# Patient Record
Sex: Female | Born: 1945 | Race: White | Hispanic: No | Smoking: Never smoker
Health system: Southern US, Community
[De-identification: ages and names within clinical notes are randomized; demographics above are authoritative.]

## PROBLEM LIST (undated history)

## (undated) DIAGNOSIS — M8580 Other specified disorders of bone density and structure, unspecified site: Secondary | ICD-10-CM

## (undated) DIAGNOSIS — IMO0001 Reserved for inherently not codable concepts without codable children: Secondary | ICD-10-CM

## (undated) DIAGNOSIS — I10 Essential (primary) hypertension: Secondary | ICD-10-CM

## (undated) DIAGNOSIS — E871 Hypo-osmolality and hyponatremia: Secondary | ICD-10-CM

## (undated) DIAGNOSIS — M7750 Other enthesopathy of unspecified foot: Secondary | ICD-10-CM

## (undated) HISTORY — DX: Other specified disorders of bone density and structure, unspecified site: M85.80

## (undated) HISTORY — DX: Reserved for inherently not codable concepts without codable children: IMO0001

## (undated) HISTORY — DX: Essential (primary) hypertension: I10

## (undated) HISTORY — DX: Hypo-osmolality and hyponatremia: E87.1

## (undated) HISTORY — DX: Other enthesopathy of unspecified foot and ankle: M77.50

---

## 2007-02-20 ENCOUNTER — Encounter: Admission: RE | Admit: 2007-02-20 | Discharge: 2007-02-20 | Payer: Self-pay | Admitting: Internal Medicine

## 2007-03-28 ENCOUNTER — Other Ambulatory Visit: Admission: RE | Admit: 2007-03-28 | Discharge: 2007-03-28 | Payer: Self-pay | Admitting: Internal Medicine

## 2008-03-20 ENCOUNTER — Encounter: Admission: RE | Admit: 2008-03-20 | Discharge: 2008-03-20 | Payer: Self-pay | Admitting: Internal Medicine

## 2009-01-22 ENCOUNTER — Encounter: Admission: RE | Admit: 2009-01-22 | Discharge: 2009-01-22 | Payer: Self-pay | Admitting: Internal Medicine

## 2009-06-07 ENCOUNTER — Other Ambulatory Visit: Admission: RE | Admit: 2009-06-07 | Discharge: 2009-06-07 | Payer: Self-pay | Admitting: Internal Medicine

## 2009-06-07 ENCOUNTER — Ambulatory Visit: Payer: Self-pay | Admitting: Internal Medicine

## 2009-06-15 ENCOUNTER — Ambulatory Visit: Payer: Self-pay

## 2009-06-15 ENCOUNTER — Encounter: Payer: Self-pay | Admitting: Internal Medicine

## 2009-07-05 ENCOUNTER — Ambulatory Visit: Payer: Self-pay | Admitting: Internal Medicine

## 2009-07-09 ENCOUNTER — Encounter: Admission: RE | Admit: 2009-07-09 | Discharge: 2009-07-09 | Payer: Self-pay | Admitting: Internal Medicine

## 2009-07-15 ENCOUNTER — Encounter: Admission: RE | Admit: 2009-07-15 | Discharge: 2009-07-15 | Payer: Self-pay | Admitting: Internal Medicine

## 2010-01-07 ENCOUNTER — Ambulatory Visit: Payer: Self-pay | Admitting: Internal Medicine

## 2010-07-12 ENCOUNTER — Ambulatory Visit: Payer: Self-pay | Admitting: Internal Medicine

## 2010-07-25 ENCOUNTER — Encounter: Admission: RE | Admit: 2010-07-25 | Discharge: 2010-07-25 | Payer: Self-pay | Admitting: Internal Medicine

## 2010-08-02 ENCOUNTER — Ambulatory Visit: Payer: Self-pay | Admitting: Internal Medicine

## 2011-02-02 ENCOUNTER — Ambulatory Visit (INDEPENDENT_AMBULATORY_CARE_PROVIDER_SITE_OTHER): Payer: 59 | Admitting: Internal Medicine

## 2011-02-02 DIAGNOSIS — I10 Essential (primary) hypertension: Secondary | ICD-10-CM

## 2011-07-12 ENCOUNTER — Other Ambulatory Visit: Payer: Self-pay | Admitting: Internal Medicine

## 2011-07-12 DIAGNOSIS — Z1231 Encounter for screening mammogram for malignant neoplasm of breast: Secondary | ICD-10-CM

## 2011-07-14 ENCOUNTER — Other Ambulatory Visit: Payer: 59 | Admitting: Internal Medicine

## 2011-07-14 DIAGNOSIS — Z0000 Encounter for general adult medical examination without abnormal findings: Secondary | ICD-10-CM

## 2011-07-14 DIAGNOSIS — I10 Essential (primary) hypertension: Secondary | ICD-10-CM

## 2011-07-14 LAB — CBC WITH DIFFERENTIAL/PLATELET
Eosinophils Absolute: 0.9 10*3/uL — ABNORMAL HIGH (ref 0.0–0.7)
Eosinophils Relative: 16 % — ABNORMAL HIGH (ref 0–5)
HCT: 41.9 % (ref 36.0–46.0)
Hemoglobin: 13.6 g/dL (ref 12.0–15.0)
Lymphocytes Relative: 30 % (ref 12–46)
Lymphs Abs: 1.8 10*3/uL (ref 0.7–4.0)
MCH: 30.8 pg (ref 26.0–34.0)
MCV: 94.8 fL (ref 78.0–100.0)
Monocytes Absolute: 0.3 10*3/uL (ref 0.1–1.0)
Monocytes Relative: 5 % (ref 3–12)
Neutrophils Relative %: 48 % (ref 43–77)
Platelets: 223 10*3/uL (ref 150–400)
RBC: 4.42 MIL/uL (ref 3.87–5.11)
RDW: 13.5 % (ref 11.5–15.5)
WBC: 5.8 10*3/uL (ref 4.0–10.5)

## 2011-07-15 LAB — LIPID PANEL
Cholesterol: 186 mg/dL (ref 0–200)
HDL: 75 mg/dL (ref >39)
LDL Cholesterol: 98 mg/dL (ref 0–99)
Total CHOL/HDL Ratio: 2.5 Ratio
Triglycerides: 64 mg/dL (ref <150)
VLDL: 13 mg/dL (ref 0–40)

## 2011-07-15 LAB — COMPREHENSIVE METABOLIC PANEL
ALT: 11 U/L (ref 0–35)
AST: 15 U/L (ref 0–37)
Alkaline Phosphatase: 30 U/L — ABNORMAL LOW (ref 39–117)
BUN: 17 mg/dL (ref 6–23)
CO2: 25 mEq/L (ref 19–32)
Creat: 0.86 mg/dL (ref 0.50–1.10)
Glucose, Bld: 85 mg/dL (ref 70–99)
Potassium: 4.7 mEq/L (ref 3.5–5.3)
Sodium: 135 mEq/L (ref 135–145)
Total Bilirubin: 0.5 mg/dL (ref 0.3–1.2)
Total Protein: 7.1 g/dL (ref 6.0–8.3)

## 2011-07-17 ENCOUNTER — Other Ambulatory Visit: Payer: Self-pay | Admitting: Internal Medicine

## 2011-07-17 ENCOUNTER — Encounter: Payer: Self-pay | Admitting: Internal Medicine

## 2011-07-17 ENCOUNTER — Ambulatory Visit (INDEPENDENT_AMBULATORY_CARE_PROVIDER_SITE_OTHER): Payer: 59 | Admitting: Internal Medicine

## 2011-07-17 VITALS — BP 138/72 | HR 64 | Temp 97.4°F | Ht 64.0 in | Wt 128.0 lb

## 2011-07-17 DIAGNOSIS — I700 Atherosclerosis of aorta: Secondary | ICD-10-CM

## 2011-07-17 DIAGNOSIS — M8580 Other specified disorders of bone density and structure, unspecified site: Secondary | ICD-10-CM | POA: Insufficient documentation

## 2011-07-17 DIAGNOSIS — IMO0001 Reserved for inherently not codable concepts without codable children: Secondary | ICD-10-CM | POA: Insufficient documentation

## 2011-07-17 DIAGNOSIS — Z0000 Encounter for general adult medical examination without abnormal findings: Secondary | ICD-10-CM

## 2011-07-17 DIAGNOSIS — I10 Essential (primary) hypertension: Secondary | ICD-10-CM

## 2011-07-17 DIAGNOSIS — M810 Age-related osteoporosis without current pathological fracture: Secondary | ICD-10-CM

## 2011-07-17 DIAGNOSIS — D721 Eosinophilia, unspecified: Secondary | ICD-10-CM | POA: Insufficient documentation

## 2011-07-17 HISTORY — DX: Other specified disorders of bone density and structure, unspecified site: M85.80

## 2011-07-17 LAB — POCT URINALYSIS DIPSTICK
Bilirubin, UA: NEGATIVE
Blood, UA: NEGATIVE
Glucose, UA: NEGATIVE
Ketones, UA: NEGATIVE
Leukocytes, UA: NEGATIVE
Nitrite, UA: NEGATIVE
Spec Grav, UA: 1
Urobilinogen, UA: NEGATIVE
pH, UA: 6

## 2011-07-17 NOTE — Progress Notes (Signed)
Subjective:    Patient ID: Peggy Nelson, female    DOB: 1945/10/19, 65 y.o.   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 History of hypertension, osteoporosis, aortic sclerosis, eosinophilia. Patient intolerant of Benadryl it causes her to "hypertension" had tonsillectomy 1954. Compound fracture of toes in 1960. She has lived in [place] for 29 years and is a native of [place]. History of tendinitis right foot for which she is taken naproxen in the past last tetanus immunization 2008. Recently had influenza immunization at work on October 4. Had Zostavax vaccine August 2010. Had colonoscopy done 2008 by Dr. Danise Edge and repeat study recommended in 10 years from that time.  One brother and 2 sisters in good health. Father died at age 47 of cancer. Mother age 26 with hypertension. Father also had hypertension. One sister and one brother with hypertension. Patient does not smoke. Social alcohol consumption. She is a widow. Spouse died at age 66 of septic shock. Is due for mammogram soon. Will get bone density study at the same time. Last bone density study was in 2010. She started on Fosamax 70 mg weekly October 2010. Takes lisinopril 20 mg daily for hypertension. Used to take lisinopril/HCTZ but was found to be hyponatremic on HCTZ.    Review of Systems  Constitutional: Negative.   HENT: Negative.   Eyes: Negative.   Cardiovascular: Negative.   Gastrointestinal: Negative.   Genitourinary: Negative.   Musculoskeletal: Negative.   Neurological: Negative.   Hematological: Negative.   Psychiatric/Behavioral: Negative.        Objective:   Physical Exam  Vitals reviewed. Constitutional: She is oriented to person, place, and time. She appears well-nourished. No distress.  HENT:  Head: Normocephalic and atraumatic.  Right Ear: External ear normal.  Left Ear: External ear  normal.  Nose: Nose normal.  Mouth/Throat: Oropharynx is clear and moist. No oropharyngeal exudate.  Eyes: EOM are normal. Pupils are equal, round, and reactive to light. Right eye exhibits no discharge. Left eye exhibits no discharge. No scleral icterus.  Neck: Neck supple. No JVD present. No thyromegaly present.  Cardiovascular: Normal rate, regular rhythm, normal heart sounds and intact distal pulses.   Pulmonary/Chest: Effort normal and breath sounds normal. She has no rales.  Abdominal: Soft. Bowel sounds are normal. She exhibits no mass. There is no tenderness.  Genitourinary:       Pap deferred. Will be done 2013  Musculoskeletal: She exhibits no edema.  Lymphadenopathy:    She has no cervical adenopathy.  Neurological: She is alert and oriented to person, place, and time. She displays normal reflexes. Coordination normal.  Skin: Skin is warm and dry. No rash noted. She is not diaphoretic.  Psychiatric: She has a normal mood and affect. Her behavior is normal. Judgment and thought content normal.          Assessment & Plan:  Hypertension- does have element of white coat syndrome. Says blood pressure at home is excellent.  Aortic sclerosis-stable  Tendinitis right foot-chronic treated with when necessary Naproxen  Osteoporosis continue Fosamax for a total of 5 years. 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 We will recheck this in 6 months. No history of allergy symptoms. No history of urticaria or hives. No boggy nasal mucosa to suspect allergic rhinitis.

## 2011-07-27 ENCOUNTER — Ambulatory Visit
Admission: RE | Admit: 2011-07-27 | Discharge: 2011-07-27 | Disposition: A | Discharge status: Home / Self Care | Payer: 59 | Source: Ambulatory Visit | Attending: Internal Medicine | Admitting: Internal Medicine

## 2011-07-27 DIAGNOSIS — M810 Age-related osteoporosis without current pathological fracture: Secondary | ICD-10-CM

## 2011-07-27 DIAGNOSIS — Z1231 Encounter for screening mammogram for malignant neoplasm of breast: Secondary | ICD-10-CM

## 2011-08-04 ENCOUNTER — Encounter: Payer: Self-pay | Admitting: Internal Medicine

## 2011-08-07 ENCOUNTER — Telehealth: Payer: Self-pay | Admitting: Internal Medicine

## 2011-08-08 NOTE — Telephone Encounter (Signed)
Spoke with patient.  Darl Pikes Large(coder) reviewed records and said to code as preventative.

## 2011-09-26 ENCOUNTER — Other Ambulatory Visit: Payer: Self-pay

## 2011-09-26 MED ORDER — ALENDRONATE SODIUM 70 MG PO TABS: 70.0000 mg | ORAL_TABLET | ORAL | Status: DC

## 2012-01-15 ENCOUNTER — Other Ambulatory Visit: Payer: 59 | Admitting: Internal Medicine

## 2012-01-15 DIAGNOSIS — D649 Anemia, unspecified: Secondary | ICD-10-CM

## 2012-01-15 DIAGNOSIS — E785 Hyperlipidemia, unspecified: Secondary | ICD-10-CM

## 2012-01-15 LAB — LIPID PANEL
Cholesterol: 180 mg/dL (ref 0–200)
HDL: 74 mg/dL (ref >39)
LDL Cholesterol: 97 mg/dL (ref 0–99)
Total CHOL/HDL Ratio: 2.4 Ratio
Triglycerides: 46 mg/dL (ref <150)
VLDL: 9 mg/dL (ref 0–40)

## 2012-01-15 LAB — CBC WITH DIFFERENTIAL/PLATELET
Basophils Absolute: 0 10*3/uL (ref 0.0–0.1)
Basophils Relative: 0 % (ref 0–1)
Eosinophils Absolute: 1 10*3/uL — ABNORMAL HIGH (ref 0.0–0.7)
Eosinophils Relative: 15 % — ABNORMAL HIGH (ref 0–5)
HCT: 42.8 % (ref 36.0–46.0)
Hemoglobin: 13.8 g/dL (ref 12.0–15.0)
Lymphocytes Relative: 27 % (ref 12–46)
Lymphs Abs: 1.8 10*3/uL (ref 0.7–4.0)
MCH: 30.1 pg (ref 26.0–34.0)
MCHC: 32.2 g/dL (ref 30.0–36.0)
MCV: 93.2 fL (ref 78.0–100.0)
Monocytes Absolute: 0.4 10*3/uL (ref 0.1–1.0)
Monocytes Relative: 6 % (ref 3–12)
Neutro Abs: 3.4 10*3/uL (ref 1.7–7.7)
Neutrophils Relative %: 53 % (ref 43–77)
Platelets: 261 10*3/uL (ref 150–400)
RBC: 4.59 MIL/uL (ref 3.87–5.11)
RDW: 13.7 % (ref 11.5–15.5)
WBC: 6.6 10*3/uL (ref 4.0–10.5)

## 2012-01-16 ENCOUNTER — Ambulatory Visit (INDEPENDENT_AMBULATORY_CARE_PROVIDER_SITE_OTHER): Payer: 59 | Admitting: Internal Medicine

## 2012-01-16 ENCOUNTER — Encounter: Payer: Self-pay | Admitting: Internal Medicine

## 2012-01-16 VITALS — BP 180/78 | HR 80 | Temp 97.7°F | Wt 130.0 lb

## 2012-01-16 DIAGNOSIS — D721 Eosinophilia, unspecified: Secondary | ICD-10-CM

## 2012-01-16 DIAGNOSIS — I10 Essential (primary) hypertension: Secondary | ICD-10-CM

## 2012-01-16 NOTE — Progress Notes (Signed)
  Subjective:    Patient ID: Peggy Nelson, female    DOB: 1946/09/11, 66 y.o.   MRN: [id number]  HPI 66 year old white female with hypertension currently on lisinopril. Could not tolerate HCTZ because of low sodium. Blood pressures been elevated recently. She recently had a cataract extraction of left eye and plan is to have one in the right eye weight April. Says she could be anxious about that. Has noticed blood pressure at home is been running in the 130s. Initially today it was up to 180 but when I rechecked it it was 160 . Other than the upcoming cataract surgery she denies any significant stress in her life. We did repeat her CBC because she has a significant eosinophilia which dates back to at least 2008. She's never been allergy tested and really doesn't have a lot in the way of allergy symptoms in terms of itchy eyes or itchy palate are itchy ears. Does have some nasal congestion from time to time. Has not had any overseas travel to suggest parasite infection.    Review of Systems     Objective:   Physical Exam chest clear to auscultation; cardiac exam regular rate and rhythm normal S1 and S2; extremities without edema        Assessment & Plan:  Hypertension-systolic elevation today etiology uncertain. She is on an ACE inhibitor and may need to add Norvasc if blood pressure remains elevated. She'll watch it at home and return here in 4 weeks.  Eosinophilia-could be allergy related. She will seek allergy testing it with [facility]. Says this has been going on since 2008, I doubt it is a blood dyscrasia.  Cataract extraction right eye due to late April. Previously had left cataract extraction late March.

## 2012-01-16 NOTE — Patient Instructions (Signed)
Continue same medications for now. Keep an eye on her blood pressure at home. Followup in 4 weeks. We will consider adding Norvasc 5 mg daily if blood pressure remains elevated.

## 2012-01-18 ENCOUNTER — Telehealth: Payer: Self-pay

## 2012-01-18 NOTE — Telephone Encounter (Signed)
Notes ana labs faxed to Dr. Dickey Callas for upcoming appointment.

## 2012-02-13 ENCOUNTER — Ambulatory Visit
Admission: RE | Admit: 2012-02-13 | Discharge: 2012-02-13 | Disposition: A | Discharge status: Home / Self Care | Payer: 59 | Source: Ambulatory Visit | Attending: Allergy | Admitting: Allergy

## 2012-02-13 ENCOUNTER — Other Ambulatory Visit: Payer: Self-pay | Admitting: Allergy

## 2012-02-13 DIAGNOSIS — R05 Cough: Secondary | ICD-10-CM

## 2012-02-13 DIAGNOSIS — R059 Cough, unspecified: Secondary | ICD-10-CM

## 2012-02-15 ENCOUNTER — Ambulatory Visit (INDEPENDENT_AMBULATORY_CARE_PROVIDER_SITE_OTHER): Payer: 59 | Admitting: Internal Medicine

## 2012-02-15 ENCOUNTER — Encounter: Payer: Self-pay | Admitting: Internal Medicine

## 2012-02-15 VITALS — BP 132/68 | HR 76 | Temp 98.7°F | Wt 130.0 lb

## 2012-02-15 DIAGNOSIS — I10 Essential (primary) hypertension: Secondary | ICD-10-CM

## 2012-02-15 DIAGNOSIS — D721 Eosinophilia, unspecified: Secondary | ICD-10-CM

## 2012-02-15 NOTE — Patient Instructions (Signed)
Continue same medications. Return in fall 2013 for physical exam.

## 2012-02-15 NOTE — Progress Notes (Signed)
  Subjective:    Patient ID: Peggy Nelson, female    DOB: 08/28/1946, 66 y.o.   MRN: [id number]  HPI Had cataract surgery April 23 right eye. Had left eye cataract surgery March 19th. Admits she was anxious over the surgeries but all went well. In today for follow up BP check. Had allergy testing at [facility] with CXR. Had no significant allergies other than bee stings. Hx blister formation when handling orange peel but can eat oranges and drink orange juice. Also has eosinophilia. Allergist did not think it was significant. We will continue to monitor that on an annual basis. Patient thinks blood pressure was elevated recently because of anxiety over surgeries. Blood pressure is acceptable today.    Review of Systems     Objective:   Physical Exam chest clear to auscultation; cardiac exam regular rate and rhythm; extremities without edema        Assessment & Plan:  Hypertension  Status post bilateral cataract surgery  Eosinophilia  Plan: Continue lisinopril. Do not add any additional antihypertensive at this point in time. Return fall 2013 for physical examination.

## 2012-04-22 ENCOUNTER — Other Ambulatory Visit: Payer: Self-pay

## 2012-04-22 MED ORDER — LISINOPRIL 20 MG PO TABS: 20.0000 mg | ORAL_TABLET | Freq: Every day | ORAL | Status: DC

## 2012-07-16 ENCOUNTER — Other Ambulatory Visit: Payer: 59 | Admitting: Internal Medicine

## 2012-07-16 DIAGNOSIS — Z0000 Encounter for general adult medical examination without abnormal findings: Secondary | ICD-10-CM

## 2012-07-16 DIAGNOSIS — I10 Essential (primary) hypertension: Secondary | ICD-10-CM

## 2012-07-16 LAB — CBC WITH DIFFERENTIAL/PLATELET
Basophils Absolute: 0 10*3/uL (ref 0.0–0.1)
Basophils Relative: 1 % (ref 0–1)
Eosinophils Absolute: 0.7 10*3/uL (ref 0.0–0.7)
Eosinophils Relative: 12 % — ABNORMAL HIGH (ref 0–5)
HCT: 40.5 % (ref 36.0–46.0)
Hemoglobin: 13.6 g/dL (ref 12.0–15.0)
Lymphocytes Relative: 41 % (ref 12–46)
Lymphs Abs: 2.2 10*3/uL (ref 0.7–4.0)
MCH: 31.2 pg (ref 26.0–34.0)
MCHC: 33.6 g/dL (ref 30.0–36.0)
MCV: 92.9 fL (ref 78.0–100.0)
Monocytes Absolute: 0.3 10*3/uL (ref 0.1–1.0)
Monocytes Relative: 6 % (ref 3–12)
Neutro Abs: 2.1 10*3/uL (ref 1.7–7.7)
Neutrophils Relative %: 40 % — ABNORMAL LOW (ref 43–77)
Platelets: 197 10*3/uL (ref 150–400)
RBC: 4.36 MIL/uL (ref 3.87–5.11)
RDW: 13.1 % (ref 11.5–15.5)
WBC: 5.2 10*3/uL (ref 4.0–10.5)

## 2012-07-16 LAB — COMPREHENSIVE METABOLIC PANEL
ALT: 10 U/L (ref 0–35)
AST: 16 U/L (ref 0–37)
Albumin: 4.4 g/dL (ref 3.5–5.2)
Alkaline Phosphatase: 30 U/L — ABNORMAL LOW (ref 39–117)
BUN: 14 mg/dL (ref 6–23)
CO2: 28 mEq/L (ref 19–32)
Calcium: 9.6 mg/dL (ref 8.4–10.5)
Chloride: 100 mEq/L (ref 96–112)
Creat: 0.84 mg/dL (ref 0.50–1.10)
Glucose, Bld: 85 mg/dL (ref 70–99)
Potassium: 4.6 mEq/L (ref 3.5–5.3)
Sodium: 136 mEq/L (ref 135–145)
Total Bilirubin: 0.5 mg/dL (ref 0.3–1.2)
Total Protein: 6.8 g/dL (ref 6.0–8.3)

## 2012-07-16 LAB — LIPID PANEL
Cholesterol: 198 mg/dL (ref 0–200)
HDL: 73 mg/dL (ref >39)
LDL Cholesterol: 109 mg/dL — ABNORMAL HIGH (ref 0–99)
Total CHOL/HDL Ratio: 2.7 Ratio
Triglycerides: 78 mg/dL (ref <150)
VLDL: 16 mg/dL (ref 0–40)

## 2012-07-16 LAB — TSH: TSH: 2.426 u[IU]/mL (ref 0.350–4.500)

## 2012-07-17 LAB — VITAMIN D 25 HYDROXY (VIT D DEFICIENCY, FRACTURES): Vit D, 25-Hydroxy: 43 ng/mL (ref 30–89)

## 2012-07-18 ENCOUNTER — Other Ambulatory Visit (HOSPITAL_COMMUNITY)
Admission: RE | Admit: 2012-07-18 | Discharge: 2012-07-18 | Disposition: A | Discharge status: Home / Self Care | Payer: 59 | Source: Ambulatory Visit | Attending: Internal Medicine | Admitting: Internal Medicine

## 2012-07-18 ENCOUNTER — Encounter: Payer: Self-pay | Admitting: Internal Medicine

## 2012-07-18 ENCOUNTER — Ambulatory Visit (INDEPENDENT_AMBULATORY_CARE_PROVIDER_SITE_OTHER): Payer: 59 | Admitting: Internal Medicine

## 2012-07-18 VITALS — BP 136/68 | HR 84 | Temp 98.7°F | Ht 64.25 in | Wt 129.0 lb

## 2012-07-18 DIAGNOSIS — I10 Essential (primary) hypertension: Secondary | ICD-10-CM

## 2012-07-18 DIAGNOSIS — M810 Age-related osteoporosis without current pathological fracture: Secondary | ICD-10-CM

## 2012-07-18 DIAGNOSIS — Z124 Encounter for screening for malignant neoplasm of cervix: Secondary | ICD-10-CM

## 2012-07-18 DIAGNOSIS — Z01419 Encounter for gynecological examination (general) (routine) without abnormal findings: Secondary | ICD-10-CM | POA: Insufficient documentation

## 2012-07-18 DIAGNOSIS — Z23 Encounter for immunization: Secondary | ICD-10-CM

## 2012-07-18 DIAGNOSIS — D721 Eosinophilia, unspecified: Secondary | ICD-10-CM

## 2012-07-18 LAB — POCT URINALYSIS DIPSTICK
Bilirubin, UA: NEGATIVE
Glucose, UA: NEGATIVE
Leukocytes, UA: NEGATIVE
Nitrite, UA: NEGATIVE
Spec Grav, UA: 1.015
Urobilinogen, UA: NEGATIVE
pH, UA: 5.5

## 2012-07-19 DIAGNOSIS — Z23 Encounter for immunization: Secondary | ICD-10-CM

## 2012-08-06 ENCOUNTER — Other Ambulatory Visit: Payer: Self-pay | Admitting: Internal Medicine

## 2012-08-06 DIAGNOSIS — Z1231 Encounter for screening mammogram for malignant neoplasm of breast: Secondary | ICD-10-CM

## 2012-08-09 ENCOUNTER — Ambulatory Visit
Admission: RE | Admit: 2012-08-09 | Discharge: 2012-08-09 | Disposition: A | Discharge status: Home / Self Care | Payer: 59 | Source: Ambulatory Visit | Attending: Internal Medicine | Admitting: Internal Medicine

## 2012-08-09 DIAGNOSIS — Z1231 Encounter for screening mammogram for malignant neoplasm of breast: Secondary | ICD-10-CM

## 2012-10-14 ENCOUNTER — Other Ambulatory Visit: Payer: Self-pay

## 2012-10-14 MED ORDER — LISINOPRIL 20 MG PO TABS: 20.0000 mg | ORAL_TABLET | Freq: Every day | ORAL | Status: DC

## 2012-10-14 MED ORDER — ALENDRONATE SODIUM 70 MG PO TABS: 70.0000 mg | ORAL_TABLET | ORAL | Status: DC

## 2012-10-21 ENCOUNTER — Other Ambulatory Visit: Payer: Self-pay

## 2012-10-21 MED ORDER — LISINOPRIL 20 MG PO TABS: 20.0000 mg | ORAL_TABLET | Freq: Every day | ORAL | Status: DC

## 2012-10-21 MED ORDER — ALENDRONATE SODIUM 70 MG PO TABS: 70.0000 mg | ORAL_TABLET | ORAL | Status: DC

## 2012-10-25 ENCOUNTER — Other Ambulatory Visit: Payer: Self-pay

## 2012-11-13 ENCOUNTER — Other Ambulatory Visit: Payer: Self-pay | Admitting: Internal Medicine

## 2012-12-16 ENCOUNTER — Encounter: Payer: Self-pay | Admitting: Internal Medicine

## 2012-12-16 NOTE — Patient Instructions (Addendum)
Continue same medications and return in 6 months 

## 2012-12-16 NOTE — Progress Notes (Signed)
Subjective:    Patient ID: Peggy Nelson, female    DOB: 1946/04/05, 67 y.o.   MRN: [id number]  HPI 67 year old white female in today for health maintenance and evaluation of medical problems. History of aortic sclerosis, hypertension, eosinophilia, osteoporosis. Patient became hyponatremic on diuretic in the past. Currently just on lisinopril 20 mg daily since November 2011. Takes Fosamax 70 mg weekly.  Patient is intolerant of Benadryl it causes her to be hyper.  Bone density study in 2010. Colonoscopy by Dr. Laural Benes August 2008. Tetanus immunization 2008. Zostavax and August 2010. Seen by allergist may 2013 regarding eosinophilia. Dr. [place] Callas recommended following counts every 6-12 months. Allergy chest may 2013 were negative. All foods tested for allergies were negative. Chest x-ray was negative.  Patient had 2-D echocardiogram 2010 with cause of murmur. Had mildly calcified aortic valve leaflets consistent with aortic sclerosis. No SBE prophylaxis needed. Osteoporosis diagnosed in 2010. Needs followup bone density study.  Compound fracture of her toes in 1960. Tonsillectomy 1954. Takes calcium and vitamin D supplements. Takes Naprelan 375 mg as needed for foot pain.  Social history: Lived in [place] for over 29 years. A native of [place].  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 She is a widow. Does not smoke. Social alcohol consumption. Mother lives with her. She walks daily.  Family history: Father died at age 97 of cancer. One brother and 2 sisters in good health.    Review of Systems  Constitutional: Negative.   HENT: Negative.   Eyes: Negative.   Respiratory: Negative.   Endocrine: Negative.   Genitourinary: Negative.   Allergic/Immunologic: Negative.   Neurological: Negative.   Hematological: Negative.   Psychiatric/Behavioral: Negative.        Objective:   Physical Exam  Vitals reviewed. Constitutional: She is oriented to  person, place, and time. She appears well-nourished. No distress.  HENT:  Head: Normocephalic and atraumatic.  Right Ear: External ear normal.  Left Ear: External ear normal.  Mouth/Throat: Oropharynx is clear and moist. No oropharyngeal exudate.  Eyes: Conjunctivae are normal. Pupils are equal, round, and reactive to light. Right eye exhibits no discharge. Left eye exhibits no discharge. No scleral icterus.  Neck: No JVD present. No thyromegaly present.  Cardiovascular: Normal rate, regular rhythm and intact distal pulses.  Exam reveals no friction rub.   Murmur heard. 2/6 SEM  Abdominal: Bowel sounds are normal. She exhibits no distension and no mass. There is no tenderness. There is no rebound and no guarding.  Genitourinary:  Deferred to GYN  Musculoskeletal: Normal range of motion. She exhibits no edema.  Lymphadenopathy:    She has no cervical adenopathy.  Neurological: She is alert and oriented to person, place, and time. She has normal reflexes. She displays normal reflexes. No cranial nerve deficit.  Skin: Skin is warm and dry. No rash noted. She is not diaphoretic.  Psychiatric: She has a normal mood and affect. Her behavior is normal. Judgment and thought content normal.          Assessment & Plan:  History of eosinophilia -- continue to follow with no further workup as long as counts are stable  Hypertension-stable on fosinopril  History of aortic sclerosis-asymptomatic  Osteoporosis-treated with Fosamax. Needs bone density study.  History of hyponatremia when treated with diuretic  History of tendinitis right foot  History of compound fracture of toes  Plan: Return in 6 months for office visit, CBC with differential, blood pressure check, office visit.  Influenza immunization given today.

## 2013-07-11 ENCOUNTER — Other Ambulatory Visit: Payer: Self-pay

## 2013-07-11 DIAGNOSIS — Z1231 Encounter for screening mammogram for malignant neoplasm of breast: Secondary | ICD-10-CM

## 2013-07-21 ENCOUNTER — Other Ambulatory Visit: Payer: Medicare Other | Admitting: Internal Medicine

## 2013-07-21 DIAGNOSIS — Z130 Encounter for screening for diseases of the blood and blood-forming organs and certain disorders involving the immune mechanism: Secondary | ICD-10-CM

## 2013-07-21 DIAGNOSIS — Z13220 Encounter for screening for lipoid disorders: Secondary | ICD-10-CM

## 2013-07-21 DIAGNOSIS — I10 Essential (primary) hypertension: Secondary | ICD-10-CM

## 2013-07-21 DIAGNOSIS — Z1329 Encounter for screening for other suspected endocrine disorder: Secondary | ICD-10-CM

## 2013-07-21 DIAGNOSIS — Z13228 Encounter for screening for other metabolic disorders: Secondary | ICD-10-CM

## 2013-07-21 DIAGNOSIS — Z131 Encounter for screening for diabetes mellitus: Secondary | ICD-10-CM

## 2013-07-21 LAB — CBC WITH DIFFERENTIAL/PLATELET
Basophils Absolute: 0 10*3/uL (ref 0.0–0.1)
Basophils Relative: 1 % (ref 0–1)
Eosinophils Absolute: 0.6 10*3/uL (ref 0.0–0.7)
Eosinophils Relative: 12 % — ABNORMAL HIGH (ref 0–5)
HCT: 41.6 % (ref 36.0–46.0)
Hemoglobin: 14.3 g/dL (ref 12.0–15.0)
Lymphs Abs: 2 10*3/uL (ref 0.7–4.0)
MCHC: 34.4 g/dL (ref 30.0–36.0)
Monocytes Absolute: 0.3 10*3/uL (ref 0.1–1.0)
Monocytes Relative: 5 % (ref 3–12)
Neutro Abs: 2.3 10*3/uL (ref 1.7–7.7)
RDW: 14 % (ref 11.5–15.5)

## 2013-07-21 LAB — COMPREHENSIVE METABOLIC PANEL
Albumin: 4.2 g/dL (ref 3.5–5.2)
Alkaline Phosphatase: 36 U/L — ABNORMAL LOW (ref 39–117)
BUN: 18 mg/dL (ref 6–23)
Creat: 0.94 mg/dL (ref 0.50–1.10)
Glucose, Bld: 100 mg/dL — ABNORMAL HIGH (ref 70–99)
Potassium: 4.5 mEq/L (ref 3.5–5.3)

## 2013-07-21 LAB — LIPID PANEL
HDL: 75 mg/dL (ref >39)
LDL Cholesterol: 109 mg/dL — ABNORMAL HIGH (ref 0–99)
Triglycerides: 91 mg/dL (ref <150)

## 2013-07-22 ENCOUNTER — Ambulatory Visit (INDEPENDENT_AMBULATORY_CARE_PROVIDER_SITE_OTHER): Payer: Medicare Other | Admitting: Internal Medicine

## 2013-07-22 ENCOUNTER — Encounter: Payer: Self-pay | Admitting: Internal Medicine

## 2013-07-22 VITALS — BP 140/80 | HR 100 | Temp 98.9°F | Ht 64.0 in | Wt 130.0 lb

## 2013-07-22 DIAGNOSIS — Z0000 Encounter for general adult medical examination without abnormal findings: Secondary | ICD-10-CM

## 2013-07-22 DIAGNOSIS — Z23 Encounter for immunization: Secondary | ICD-10-CM

## 2013-07-22 LAB — POCT URINALYSIS DIPSTICK
Bilirubin, UA: NEGATIVE
Blood, UA: NEGATIVE
Ketones, UA: NEGATIVE
Spec Grav, UA: 1.01
Urobilinogen, UA: NEGATIVE
pH, UA: 6.5

## 2013-07-22 LAB — VITAMIN D 25 HYDROXY (VIT D DEFICIENCY, FRACTURES): Vit D, 25-Hydroxy: 55 ng/mL (ref 30–89)

## 2013-07-22 NOTE — Progress Notes (Signed)
Subjective:    Patient ID: Peggy Nelson, female    DOB: 06-Sep-1946, 67 y.o.   MRN: [id number]  HPI  67 year old White Female for Welcome to Medicare Exam. History of aortic sclerosis, hypertension, eosinophilia, osteoporosis. Patient has history of being hyponatremic on diuretic in the past. Currently just on lisinopril 20 mg daily for hypertension since November 2011. Takes Fosamax 70 mg weekly for osteoporosis.  Patient is intolerant of Benadryl-it causes her to be hyperactive.  Patient had 2-D echocardiogram 2010 do to murmur. She had mildly calcified aortic valve leaflets consistent with aortic sclerosis. No SBE prophylaxis needed.  Osteoporosis diagnosed in 2010 be a bone density study. Last bone density study done in 2012 showing significant osteoporosis. Colonoscopy by Dr. Laural Benes in August 2008. Tetanus immunization in 2008. Zostavax vaccine given August 2010.  Seen by allergist in May 2013 regarding eosinophilia. Dr. [place] Callas recommended following counts every 6-12 months. Allergy test done in May 2013 were negative. All foods tested for allergies were negative. Chest x-ray was negative.  Additional past medical history: Compound fracture of her toes in 1960. Tonsillectomy 1954.  She takes as needed for foot pain. She takes calcium and vitamin D supplements.  Social history: She is a native of [place] but has lived in [place] for over 29 years. 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 She is a widow. Does not smoke. Social alcohol consumption. Mother lives with her. She walks daily.  Family history: Father died at age 44 of cancer. One brother and 2 sisters in good health.      Review of Systems  Constitutional: Negative.   All other systems reviewed and are negative.       Objective:   Physical Exam  Vitals reviewed. Constitutional: She appears well-developed and well-nourished. No distress.  Eyes: Conjunctivae and EOM are normal.   Neck: Neck supple.  Pulmonary/Chest:  Breasts normal female  Genitourinary:  Pap done 2013 deferred. Bimanual exam normal  Skin: Skin is warm and dry. No rash noted. She is not diaphoretic.  Psychiatric: She has a normal mood and affect. Her behavior is normal. Judgment and thought content normal.          Assessment & Plan:   Essential hypertension-stable current regimen  Osteoporosis-treated with vitamin D, calcium supplement and Fosamax. Last bone density study 2012. Repeat bone density study in 2015.  History of eosinophilia-followed with CBCs. Previously seen by allergist.  History of aortic sclerosis-asymptomatic  Plan: Return in one year or as needed. Continue same medications. Recommend annual mammogram.     Subjective:   Patient presents for Medicare Annual/Subsequent preventive examination.   Review Past Medical/Family/Social: no change   Risk Factors  Current exercise habits: walk- 5 miles a week Dietary issues discussed: low fat low carb diet  Cardiac risk factors: see above for HPI  Depression Screen  (Note: if answer to either of the following is "Yes", a more complete depression screening is indicated)   Over the past two weeks, have you felt down, depressed or hopeless? No  Over the past two weeks, have you felt little interest or pleasure in doing things? No Have you lost interest or pleasure in daily life? No Do you often feel hopeless? No Do you cry easily over simple problems? No   Activities of Daily Living  In your present state of health, do you have any difficulty performing the following activities?:   Driving? No  Managing money? No  Feeding yourself? No  Getting from bed to chair? No  Climbing a flight of stairs? No  Preparing food and eating?: No  Bathing or showering? No  Getting dressed: No  Getting to the toilet? No  Using the toilet:No  Moving around from place to place: No  In the past year have you fallen or had a near  fall?:No  Are you sexually active? No  Do you have more than one partner? No   Hearing Difficulties: No  Do you often ask people to speak up or repeat themselves? No  Do you experience ringing or noises in your ears? No  Do you have difficulty understanding soft or whispered voices? No  Do you feel that you have a problem with memory? No Do you often misplace items? No    Home Safety:  Do you have a smoke alarm at your residence? Yes Do you have grab bars in the bathroom? yes Do you have throw rugs in your house? yes   Cognitive Testing  Alert? Yes Normal Appearance?Yes  Oriented to person? Yes Place? Yes  Time? Yes  Recall of three objects? Yes  Can perform simple calculations? Yes  Displays appropriate judgment?Yes  Can read the correct time from a watch face?Yes   List the Names of Other Physician/Practitioners you currently use:  See referral list for the physicians patient is currently seeing. Dr. Elmer Picker, opthalmology    Review of Systems: see above   Objective:     General appearance: Appears stated age  Head: Normocephalic, without obvious abnormality, atraumatic  Eyes: conj clear, EOMi PEERLA  Ears: normal TM's and external ear canals both ears  Nose: Nares normal. Septum midline. Mucosa normal. No drainage or sinus tenderness.  Throat: lips, mucosa, and tongue normal; teeth and gums normal  Neck: no adenopathy, no carotid bruit, no JVD, supple, symmetrical, trachea midline and thyroid not enlarged, symmetric, no tenderness/mass/nodules  No CVA tenderness.  Lungs: clear to auscultation bilaterally  Breasts: normal appearance, no masses or tendernessHeart: regular rate and rhythm, S1, S2 normal, no murmur, click, rub or gallop  Abdomen: soft, non-tender; bowel sounds normal; no masses, no organomegaly  Musculoskeletal: ROM normal in all joints, no crepitus, no deformity, Normal muscle strengthen. Back  is symmetric, no curvature. Skin: Skin color, texture,  turgor normal. No rashes or lesions  Lymph nodes: Cervical, supraclavicular, and axillary nodes normal.  Neurologic: CN 2 -12 Normal, Normal symmetric reflexes. Normal coordination and gait  Psych: Alert & Oriented x 3, Mood appear stable.    Assessment:    Annual wellness medicare exam   Plan:    During the course of the visit the patient was educated and counseled about appropriate screening and preventive services including:   Annual mammogram Flu vaccine given Colonoscopy up to date Wears seatbelts Bone density study last year     Patient Instructions (the written plan) was given to the patient.  Medicare Attestation  I have personally reviewed:  The patient's medical and social history  Their use of alcohol, tobacco or illicit drugs  Their current medications and supplements  The patient's functional ability including ADLs,fall risks, home safety risks, cognitive, and hearing and visual impairment  Diet and physical activities  Evidence for depression or mood disorders  The patient's weight, height, BMI, and visual acuity have been recorded in the chart. I have made referrals, counseling, and provided education to the patient based on review of the above and I have provided the patient with a  written personalized care plan for preventive services.

## 2013-07-22 NOTE — Patient Instructions (Addendum)
Continue same medications and return in one year. Please monitor blood pressure with home blood pressure cuff. Call if persistently elevated.

## 2013-08-06 ENCOUNTER — Ambulatory Visit (INDEPENDENT_AMBULATORY_CARE_PROVIDER_SITE_OTHER): Payer: Medicare Other | Admitting: Internal Medicine

## 2013-08-06 DIAGNOSIS — Z23 Encounter for immunization: Secondary | ICD-10-CM

## 2013-08-12 ENCOUNTER — Ambulatory Visit: Payer: 59

## 2013-08-12 ENCOUNTER — Ambulatory Visit
Admission: RE | Admit: 2013-08-12 | Discharge: 2013-08-12 | Disposition: A | Discharge status: Home / Self Care | Payer: Medicare Other | Source: Ambulatory Visit

## 2013-08-12 DIAGNOSIS — Z1231 Encounter for screening mammogram for malignant neoplasm of breast: Secondary | ICD-10-CM

## 2013-08-27 ENCOUNTER — Other Ambulatory Visit: Payer: Self-pay | Admitting: *Deleted

## 2013-08-27 MED ORDER — LISINOPRIL 20 MG PO TABS: 20.0000 mg | ORAL_TABLET | Freq: Every day | ORAL | Status: DC

## 2013-08-27 MED ORDER — ALENDRONATE SODIUM 70 MG PO TABS: ORAL_TABLET | ORAL | Status: DC

## 2013-12-04 ENCOUNTER — Encounter: Payer: Self-pay | Admitting: Internal Medicine

## 2014-01-29 ENCOUNTER — Other Ambulatory Visit: Payer: Medicare Other | Admitting: Internal Medicine

## 2014-01-30 ENCOUNTER — Ambulatory Visit (INDEPENDENT_AMBULATORY_CARE_PROVIDER_SITE_OTHER): Payer: Medicare Other | Admitting: Internal Medicine

## 2014-01-30 ENCOUNTER — Encounter: Payer: Self-pay | Admitting: Internal Medicine

## 2014-01-30 VITALS — BP 130/70 | HR 84 | Temp 98.1°F | Wt 131.0 lb

## 2014-01-30 DIAGNOSIS — I10 Essential (primary) hypertension: Secondary | ICD-10-CM

## 2014-01-30 NOTE — Patient Instructions (Signed)
Continue same medication return in 6 months for physical exam

## 2014-01-30 NOTE — Progress Notes (Signed)
   Subjective:    Patient ID: Peggy Nelson, female    DOB: 02/28/1946, 68 y.o.   MRN: [id number]  HPI  68 year old white female in today for six-month recheck on hypertension. Has been completely retired now for almost a year. Is enjoying herself and finding things to do. Brings in multiple blood pressure readings over the last couple of months. Systolic blood pressure has ranged from 112-135. Diastolics have ranged from 56-70. Feels well on blood pressure medication at this point in time. Says sometimes it runs a bit high early morning but I feel these readings are acceptable.    Review of Systems     Objective:   Physical Exam  Neck is supple without JVD thyromegaly or carotid bruits. Chest clear to auscultation. Cardiac exam regular rate and rhythm normal S1 and S2. Extremities without edema.     Assessment & Plan:  Plan: Patient wl return  else in for physical examination mid-October 2015      Hypertension well controlled  Plan: Return in 6 months for physical exam.

## 2014-07-27 ENCOUNTER — Other Ambulatory Visit: Payer: Medicare Other | Admitting: Internal Medicine

## 2014-07-27 DIAGNOSIS — Z130 Encounter for screening for diseases of the blood and blood-forming organs and certain disorders involving the immune mechanism: Secondary | ICD-10-CM

## 2014-07-27 DIAGNOSIS — Z0000 Encounter for general adult medical examination without abnormal findings: Secondary | ICD-10-CM

## 2014-07-27 DIAGNOSIS — Z13220 Encounter for screening for lipoid disorders: Secondary | ICD-10-CM

## 2014-07-27 DIAGNOSIS — Z1329 Encounter for screening for other suspected endocrine disorder: Secondary | ICD-10-CM

## 2014-07-27 DIAGNOSIS — I10 Essential (primary) hypertension: Secondary | ICD-10-CM

## 2014-07-27 LAB — LIPID PANEL
CHOLESTEROL: 201 mg/dL — AB (ref 0–200)
HDL: 80 mg/dL (ref >39)
LDL CALC: 106 mg/dL — AB (ref 0–99)
TRIGLYCERIDES: 77 mg/dL (ref <150)
Total CHOL/HDL Ratio: 2.5 Ratio
VLDL: 15 mg/dL (ref 0–40)

## 2014-07-27 LAB — CBC WITH DIFFERENTIAL/PLATELET
BASOS PCT: 0 % (ref 0–1)
Basophils Absolute: 0 10*3/uL (ref 0.0–0.1)
EOS PCT: 21 % — AB (ref 0–5)
Eosinophils Absolute: 1.1 10*3/uL — ABNORMAL HIGH (ref 0.0–0.7)
HEMATOCRIT: 40.8 % (ref 36.0–46.0)
HEMOGLOBIN: 14.2 g/dL (ref 12.0–15.0)
LYMPHS ABS: 1.8 10*3/uL (ref 0.7–4.0)
Lymphocytes Relative: 36 % (ref 12–46)
MCH: 31.7 pg (ref 26.0–34.0)
MCHC: 34.8 g/dL (ref 30.0–36.0)
MCV: 91.1 fL (ref 78.0–100.0)
MONO ABS: 0.3 10*3/uL (ref 0.1–1.0)
Monocytes Relative: 6 % (ref 3–12)
NEUTROS PCT: 37 % — AB (ref 43–77)
Neutro Abs: 1.9 10*3/uL (ref 1.7–7.7)
Platelets: 259 10*3/uL (ref 150–400)
RBC: 4.48 MIL/uL (ref 3.87–5.11)
RDW: 13.6 % (ref 11.5–15.5)
WBC: 5.1 10*3/uL (ref 4.0–10.5)

## 2014-07-27 LAB — COMPREHENSIVE METABOLIC PANEL
ALK PHOS: 37 U/L — AB (ref 39–117)
ALT: 11 U/L (ref 0–35)
AST: 17 U/L (ref 0–37)
Albumin: 4.3 g/dL (ref 3.5–5.2)
BILIRUBIN TOTAL: 0.6 mg/dL (ref 0.2–1.2)
BUN: 16 mg/dL (ref 6–23)
CO2: 27 mEq/L (ref 19–32)
CREATININE: 0.9 mg/dL (ref 0.50–1.10)
Calcium: 9.6 mg/dL (ref 8.4–10.5)
Chloride: 97 mEq/L (ref 96–112)
Glucose, Bld: 92 mg/dL (ref 70–99)
Potassium: 4.6 mEq/L (ref 3.5–5.3)
SODIUM: 135 meq/L (ref 135–145)
TOTAL PROTEIN: 7 g/dL (ref 6.0–8.3)

## 2014-07-27 LAB — TSH: TSH: 3.715 u[IU]/mL (ref 0.350–4.500)

## 2014-07-28 ENCOUNTER — Ambulatory Visit (INDEPENDENT_AMBULATORY_CARE_PROVIDER_SITE_OTHER): Payer: Medicare Other | Admitting: Internal Medicine

## 2014-07-28 VITALS — BP 148/72 | HR 85 | Temp 98.3°F | Ht 63.5 in | Wt 133.0 lb

## 2014-07-28 DIAGNOSIS — I10 Essential (primary) hypertension: Secondary | ICD-10-CM

## 2014-07-28 DIAGNOSIS — IMO0001 Reserved for inherently not codable concepts without codable children: Secondary | ICD-10-CM

## 2014-07-28 DIAGNOSIS — Z0000 Encounter for general adult medical examination without abnormal findings: Secondary | ICD-10-CM

## 2014-07-28 DIAGNOSIS — M810 Age-related osteoporosis without current pathological fracture: Secondary | ICD-10-CM

## 2014-07-28 DIAGNOSIS — I700 Atherosclerosis of aorta: Secondary | ICD-10-CM

## 2014-07-28 DIAGNOSIS — Z23 Encounter for immunization: Secondary | ICD-10-CM

## 2014-07-28 DIAGNOSIS — Z862 Personal history of diseases of the blood and blood-forming organs and certain disorders involving the immune mechanism: Secondary | ICD-10-CM

## 2014-07-28 DIAGNOSIS — Z1239 Encounter for other screening for malignant neoplasm of breast: Secondary | ICD-10-CM

## 2014-07-28 LAB — POCT URINALYSIS DIPSTICK
Bilirubin, UA: NEGATIVE
GLUCOSE UA: NEGATIVE
Ketones, UA: NEGATIVE
Nitrite, UA: NEGATIVE
PROTEIN UA: NEGATIVE
RBC UA: NEGATIVE
SPEC GRAV UA: 1.01
Urobilinogen, UA: NEGATIVE
pH, UA: 6.5

## 2014-07-30 ENCOUNTER — Other Ambulatory Visit: Payer: Self-pay | Admitting: Internal Medicine

## 2014-07-30 DIAGNOSIS — Z1231 Encounter for screening mammogram for malignant neoplasm of breast: Secondary | ICD-10-CM

## 2014-08-21 ENCOUNTER — Ambulatory Visit
Admission: RE | Admit: 2014-08-21 | Discharge: 2014-08-21 | Disposition: A | Discharge status: Home / Self Care | Payer: Medicare Other | Source: Ambulatory Visit | Attending: Internal Medicine | Admitting: Internal Medicine

## 2014-08-21 DIAGNOSIS — Z0000 Encounter for general adult medical examination without abnormal findings: Secondary | ICD-10-CM

## 2014-08-21 DIAGNOSIS — M8580 Other specified disorders of bone density and structure, unspecified site: Secondary | ICD-10-CM

## 2014-08-21 DIAGNOSIS — Z1231 Encounter for screening mammogram for malignant neoplasm of breast: Secondary | ICD-10-CM

## 2014-08-21 DIAGNOSIS — Z1239 Encounter for other screening for malignant neoplasm of breast: Secondary | ICD-10-CM

## 2014-08-24 ENCOUNTER — Telehealth: Payer: Self-pay

## 2014-08-24 MED ORDER — LISINOPRIL 20 MG PO TABS: 20.0000 mg | ORAL_TABLET | Freq: Every day | ORAL | Status: DC

## 2014-08-24 MED ORDER — ALENDRONATE SODIUM 70 MG PO TABS: ORAL_TABLET | ORAL | Status: DC

## 2014-08-24 NOTE — Telephone Encounter (Signed)
-----   Message from Margaree MackintoshMary J Baxley, MD sent at 08/21/2014  3:38 PM EST ----- Fosamax is working. Please continue.

## 2014-08-24 NOTE — Telephone Encounter (Signed)
Patient aware of bone density results.  Order for fosamax and lisinopril printed per patient for refill.

## 2014-09-06 ENCOUNTER — Encounter: Payer: Self-pay | Admitting: Internal Medicine

## 2014-09-06 NOTE — Progress Notes (Signed)
Subjective:    Patient ID: Peggy Nelson, female    DOB: 02/13/1946, 68 y.o.   MRN: [id number]  HPI  68 year old white female in today for health maintenance exam and evaluation of medical issues. History of aortic sclerosis, hypertension, eosinophilia, osteoporosis. 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 Currently just on the sun up real 20 mg daily for hypertension since November 2011. Takes Fosamax 70 mg weekly for osteoporosis.  Patient had a 2-D echocardiogram in 2010 because of murmur. She had mildly calcified aortic valve leaflets consistent with aortic sclerosis. No SBE prophylaxis needed.  Patient had a compound fracture of her toes in 1960. Tonsillectomy 1954.  Social history: She is a native of [place] but has lived in [place] for over 29 years. 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 Has a college degree. She is a widow. Does not smoke. Social alcohol consumption. Mother lives with her. She walks daily.  Family history: Father died at age 67 of cancer. One brother and 2 sisters in good health.  Osteoporosis diagnosed in 2010 by bone density study. She is on Fosamax.  Colonoscopy done by Dr. Laural BenesJohnson in 2008.  Tetanus immunization in 2008. Zostavax vaccine given August 2010.  Patient is intolerant of Benadryl-it causes her to be hyperactive.  Patient saw Dr. Beaver CallasSharma in May 2013 Re: Eosinophilia. He recommended following counts every 6-12 months. Allergy testing done in May 2013 was negative. All foods tested for allergy were negative. Chest x-ray was negative.    Review of Systems  Constitutional: Negative.   All other systems reviewed and are negative.      Objective:   Physical Exam  Constitutional: She is oriented to person, place, and time. She appears well-developed and well-nourished. No distress.  HENT:  Head: Normocephalic and atraumatic.  Right Ear: External ear normal.  Left Ear: External ear normal.    Mouth/Throat: Oropharynx is clear and moist.  Eyes: Conjunctivae and EOM are normal. Pupils are equal, round, and reactive to light. Right eye exhibits no discharge. Left eye exhibits no discharge.  Neck: Neck supple. No JVD present. No thyromegaly present.  Cardiovascular: Normal rate and regular rhythm.   1/6 systolic ejection murmur  Pulmonary/Chest: Effort normal and breath sounds normal. No respiratory distress. She has no wheezes. She has no rales.  Breasts normal female  Abdominal: Bowel sounds are normal. She exhibits no distension. There is no tenderness. There is no rebound and no guarding.  Genitourinary:  Pap done in 2013. Bimanual exam normal.  Musculoskeletal: Normal range of motion. She exhibits no edema.  Lymphadenopathy:    She has no cervical adenopathy.  Neurological: She is alert and oriented to person, place, and time. She has normal reflexes. No cranial nerve deficit. Coordination normal.  Skin: Skin is warm and dry. No rash noted. She is not diaphoretic.  Psychiatric: She has a normal mood and affect. Her behavior is normal. Judgment and thought content normal.  Vitals reviewed.         Assessment & Plan:  History of aortic sclerosis- stable and asymptomatic  Essential hypertension stable on current regimen   Osteoporosis-stable with Fosamax. Have bone density study.  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 Previously seen by allergist.  Plan: Return in one year or as needed.  Subjective:   Patient presents for Medicare Annual/Subsequent preventive examination.  Review Past Medical/Family/Social: See above   Risk Factors  Current exercise habits: Fairly active Dietary issues  discussed: Low fat low car  Cardiac risk factors: Hypertension  Depression Screen  (Note: if answer to either of the following is "Yes", a more complete depression screening is indicated)   Over the past two weeks, have you felt down, depressed or hopeless? No   Over the past two weeks, have you felt little interest or pleasure in doing things? No Have you lost interest or pleasure in daily life? No Do you often feel hopeless? No Do you cry easily over simple problems? No   Activities of Daily Living  In your present state of health, do you have any difficulty performing the following activities?:   Driving? No  Managing money? No  Feeding yourself? No  Getting from bed to chair? No  Climbing a flight of stairs? No  Preparing food and eating?: No  Bathing or showering? No  Getting dressed: No  Getting to the toilet? No  Using the toilet:No  Moving around from place to place: No  In the past year have you fallen or had a near fall?:No  Are you sexually active? No  Do you have more than one partner? No   Hearing Difficulties: No  Do you often ask people to speak up or repeat themselves? No  Do you experience ringing or noises in your ears? No  Do you have difficulty understanding soft or whispered voices? No  Do you feel that you have a problem with memory? No Do you often misplace items? No    Home Safety:  Do you have a smoke alarm at your residence? Yes Do you have grab bars in the bathroom? Yes Do you have throw rugs in your house? Yes   Cognitive Testing  Alert? Yes Normal Appearance?Yes  Oriented to person? Yes Place? Yes  Time? Yes  Recall of three objects? Yes  Can perform simple calculations? Yes  Displays appropriate judgment?Yes  Can read the correct time from a watch face?Yes   List the Names of Other Physician/Practitioners you currently use:  See referral list for the physicians patient is currently seeing.  Eye physician   Review of Systems: See above  Objective:     General appearance: Appears stated age  Head: Normocephalic, without obvious abnormality, atraumatic  Eyes: conj clear, EOMi PEERLA  Ears: normal TM's and external ear canals both ears  Nose: Nares normal. Septum midline. Mucosa normal.  No drainage or sinus tenderness.  Throat: lips, mucosa, and tongue normal; teeth and gums normal  Neck: no adenopathy, no carotid bruit, no JVD, supple, symmetrical, trachea midline and thyroid not enlarged, symmetric, no tenderness/mass/nodules  No CVA tenderness.  Lungs: clear to auscultation bilaterally  Breasts: normal appearance, no masses or tenderness Heart: regular rate and rhythm, S1, S2 normal Abdomen: soft, non-tender; bowel sounds normal; no masses, no organomegaly  Musculoskeletal: ROM normal in all joints, no crepitus, no deformity, Normal muscle strengthen. Back  is symmetric, no curvature. Skin: Skin color, texture, turgor normal. No rashes or lesions  Lymph nodes: Cervical, supraclavicular, and axillary nodes normal.  Neurologic: CN 2 -12 Normal, Normal symmetric reflexes. Normal coordination and gait  Psych: Alert & Oriented x 3, Mood appear stable.    Assessment:    Annual wellness medicare exam   Plan:    During the course of the visit the patient was educated and counseled about appropriate screening and preventive services including:   Annual mammogram  Patient have bone density study this year     Patient Instructions (the written plan)  was given to the patient.  Medicare Attestation  I have personally reviewed:  The patient's medical and social history  Their use of alcohol, tobacco or illicit drugs  Their current medications and supplements  The patient's functional ability including ADLs,fall risks, home safety risks, cognitive, and hearing and visual impairment  Diet and physical activities  Evidence for depression or mood disorders  The patient's weight, height, BMI, and visual acuity have been recorded in the chart. I have made referrals, counseling, and provided education to the patient based on review of the above and I have provided the patient with a written personalized care plan for preventive services.

## 2014-09-06 NOTE — Patient Instructions (Signed)
Return in one year or as needed. Continue same medication. Have bone density study.

## 2014-09-23 ENCOUNTER — Encounter: Payer: Self-pay | Admitting: Internal Medicine

## 2015-02-04 ENCOUNTER — Other Ambulatory Visit: Payer: Medicare Other | Admitting: Internal Medicine

## 2015-02-04 DIAGNOSIS — R5383 Other fatigue: Secondary | ICD-10-CM

## 2015-02-04 LAB — CBC WITH DIFFERENTIAL/PLATELET
BASOS PCT: 0 % (ref 0–1)
Basophils Absolute: 0 10*3/uL (ref 0.0–0.1)
Eosinophils Absolute: 0.7 10*3/uL (ref 0.0–0.7)
Eosinophils Relative: 13 % — ABNORMAL HIGH (ref 0–5)
HCT: 40 % (ref 36.0–46.0)
Hemoglobin: 13.3 g/dL (ref 12.0–15.0)
LYMPHS PCT: 34 % (ref 12–46)
Lymphs Abs: 1.7 10*3/uL (ref 0.7–4.0)
MCH: 30.9 pg (ref 26.0–34.0)
MCHC: 33.3 g/dL (ref 30.0–36.0)
MCV: 92.8 fL (ref 78.0–100.0)
MPV: 9.2 fL (ref 8.6–12.4)
Monocytes Absolute: 0.4 10*3/uL (ref 0.1–1.0)
Monocytes Relative: 8 % (ref 3–12)
NEUTROS ABS: 2.3 10*3/uL (ref 1.7–7.7)
NEUTROS PCT: 45 % (ref 43–77)
PLATELETS: 233 10*3/uL (ref 150–400)
RBC: 4.31 MIL/uL (ref 3.87–5.11)
RDW: 13.9 % (ref 11.5–15.5)
WBC: 5.1 10*3/uL (ref 4.0–10.5)

## 2015-02-05 ENCOUNTER — Ambulatory Visit (INDEPENDENT_AMBULATORY_CARE_PROVIDER_SITE_OTHER): Payer: Medicare Other | Admitting: Internal Medicine

## 2015-02-05 ENCOUNTER — Encounter: Payer: Self-pay | Admitting: Internal Medicine

## 2015-02-05 VITALS — BP 128/76 | HR 74 | Temp 98.0°F | Wt 133.0 lb

## 2015-02-05 DIAGNOSIS — D721 Eosinophilia, unspecified: Secondary | ICD-10-CM

## 2015-02-05 DIAGNOSIS — I10 Essential (primary) hypertension: Secondary | ICD-10-CM

## 2015-02-05 NOTE — Patient Instructions (Signed)
Continue to monitor eosinophilia. Continue same antihypertensive medication. Return in 6 months for physical examination.

## 2015-02-05 NOTE — Progress Notes (Signed)
   Subjective:    Patient ID: Peggy Nelson, female    DOB: 06/25/1946, 69 y.o.   MRN: [id number]  HPI  Patient brings in multiple blood pressure readings which are all very acceptable. Here today to follow-up on hypertension at 6 months interval. She also had a CBC because at last visit she had 21% eosinophils which is high. Generally has 2012 and 13%. She is asymptomatic. She did see allergist who was not too concerned about the eosinophilia. CBC drawn recently shows 13% eosinophils which is stable and improved from last reading.  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    Review of Systems     Objective:   Physical Exam  Skin warm and dry. Nodes none. No adenopathy. No JVD or thyromegaly. No carotid bruits. Chest clear to auscultation. Cardiac exam regular rate and rhythm normal S1 and S2. Extremities without edema.      Assessment & Plan:  Essential hypertension-stable on current regimen  Eosinophilia  Plan: Return in 6 months for office visit and physical examination. Continue same medications. Continue to monitor eosinophilia every 6 months

## 2015-07-29 ENCOUNTER — Other Ambulatory Visit: Payer: Medicare Other | Admitting: Internal Medicine

## 2015-07-30 ENCOUNTER — Encounter: Payer: Medicare Other | Admitting: Internal Medicine

## 2015-08-05 ENCOUNTER — Other Ambulatory Visit: Payer: Medicare Other | Admitting: Internal Medicine

## 2015-08-05 DIAGNOSIS — Z1329 Encounter for screening for other suspected endocrine disorder: Secondary | ICD-10-CM

## 2015-08-05 DIAGNOSIS — M810 Age-related osteoporosis without current pathological fracture: Secondary | ICD-10-CM

## 2015-08-05 DIAGNOSIS — Z0000 Encounter for general adult medical examination without abnormal findings: Secondary | ICD-10-CM

## 2015-08-05 DIAGNOSIS — Z130 Encounter for screening for diseases of the blood and blood-forming organs and certain disorders involving the immune mechanism: Secondary | ICD-10-CM

## 2015-08-05 DIAGNOSIS — I10 Essential (primary) hypertension: Secondary | ICD-10-CM

## 2015-08-05 LAB — CBC WITH DIFFERENTIAL/PLATELET
BASOS ABS: 0 10*3/uL (ref 0.0–0.1)
BASOS PCT: 1 % (ref 0–1)
EOS ABS: 0.6 10*3/uL (ref 0.0–0.7)
EOS PCT: 15 % — AB (ref 0–5)
HCT: 41 % (ref 36.0–46.0)
Hemoglobin: 14.2 g/dL (ref 12.0–15.0)
Lymphocytes Relative: 38 % (ref 12–46)
Lymphs Abs: 1.6 10*3/uL (ref 0.7–4.0)
MCH: 31.8 pg (ref 26.0–34.0)
MCHC: 34.6 g/dL (ref 30.0–36.0)
MCV: 91.7 fL (ref 78.0–100.0)
MONO ABS: 0.3 10*3/uL (ref 0.1–1.0)
MPV: 9.2 fL (ref 8.6–12.4)
Monocytes Relative: 7 % (ref 3–12)
Neutro Abs: 1.7 10*3/uL (ref 1.7–7.7)
Neutrophils Relative %: 39 % — ABNORMAL LOW (ref 43–77)
PLATELETS: 220 10*3/uL (ref 150–400)
RBC: 4.47 MIL/uL (ref 3.87–5.11)
RDW: 13.6 % (ref 11.5–15.5)
WBC: 4.3 10*3/uL (ref 4.0–10.5)

## 2015-08-05 LAB — COMPLETE METABOLIC PANEL WITH GFR
ALT: 11 U/L (ref 6–29)
AST: 15 U/L (ref 10–35)
Albumin: 4.4 g/dL (ref 3.6–5.1)
Alkaline Phosphatase: 29 U/L — ABNORMAL LOW (ref 33–130)
BUN: 19 mg/dL (ref 7–25)
CHLORIDE: 96 mmol/L — AB (ref 98–110)
CO2: 28 mmol/L (ref 20–31)
Calcium: 9.7 mg/dL (ref 8.6–10.4)
Creat: 0.78 mg/dL (ref 0.50–0.99)
GFR, Est African American: >89 mL/min (ref >=60)
GFR, Est Non African American: 79 mL/min (ref >=60)
Glucose, Bld: 93 mg/dL (ref 65–99)
Potassium: 5.5 mmol/L — ABNORMAL HIGH (ref 3.5–5.3)
Sodium: 133 mmol/L — ABNORMAL LOW (ref 135–146)
Total Bilirubin: 0.5 mg/dL (ref 0.2–1.2)
Total Protein: 7.3 g/dL (ref 6.1–8.1)

## 2015-08-05 LAB — LIPID PANEL
CHOL/HDL RATIO: 2.3 ratio (ref <=5.0)
Cholesterol: 192 mg/dL (ref 125–200)
HDL: 83 mg/dL (ref >=46)
LDL Cholesterol: 96 mg/dL (ref <130)
Triglycerides: 66 mg/dL (ref <150)
VLDL: 13 mg/dL (ref <30)

## 2015-08-05 LAB — TSH: TSH: 2.098 u[IU]/mL (ref 0.350–4.500)

## 2015-08-06 LAB — VITAMIN D 25 HYDROXY (VIT D DEFICIENCY, FRACTURES): VIT D 25 HYDROXY: 46 ng/mL (ref 30–100)

## 2015-08-09 ENCOUNTER — Encounter: Payer: Self-pay | Admitting: Internal Medicine

## 2015-08-09 ENCOUNTER — Ambulatory Visit (INDEPENDENT_AMBULATORY_CARE_PROVIDER_SITE_OTHER): Payer: Medicare Other | Admitting: Internal Medicine

## 2015-08-09 VITALS — BP 134/80 | HR 78 | Temp 98.0°F | Resp 18 | Ht 64.0 in | Wt 130.0 lb

## 2015-08-09 DIAGNOSIS — I700 Atherosclerosis of aorta: Secondary | ICD-10-CM | POA: Diagnosis not present

## 2015-08-09 DIAGNOSIS — D721 Eosinophilia, unspecified: Secondary | ICD-10-CM

## 2015-08-09 DIAGNOSIS — Z23 Encounter for immunization: Secondary | ICD-10-CM | POA: Diagnosis not present

## 2015-08-09 DIAGNOSIS — IMO0001 Reserved for inherently not codable concepts without codable children: Secondary | ICD-10-CM

## 2015-08-09 DIAGNOSIS — M810 Age-related osteoporosis without current pathological fracture: Secondary | ICD-10-CM

## 2015-08-09 DIAGNOSIS — Z0000 Encounter for general adult medical examination without abnormal findings: Secondary | ICD-10-CM

## 2015-08-09 DIAGNOSIS — I10 Essential (primary) hypertension: Secondary | ICD-10-CM | POA: Diagnosis not present

## 2015-08-09 LAB — POCT URINALYSIS DIPSTICK
BILIRUBIN UA: NEGATIVE
Blood, UA: NEGATIVE
GLUCOSE UA: NEGATIVE
Ketones, UA: NEGATIVE
LEUKOCYTES UA: NEGATIVE
NITRITE UA: NEGATIVE
Protein, UA: NEGATIVE
Spec Grav, UA: 1.02
UROBILINOGEN UA: 0.2
pH, UA: 6.5

## 2015-08-09 LAB — POTASSIUM: POTASSIUM: 4.4 mmol/L (ref 3.5–5.3)

## 2015-08-09 NOTE — Progress Notes (Signed)
Subjective:    Patient ID: Peggy Nelson, female    DOB: [birth year], 69 y.o.   MRN: [id number]  HPI 69 year old Female in today for health maintenance exam and evaluation of medical issues. She has a history of hypertension, aortic sclerosis, eosinophilia, osteoporosis. History of been hyponatremic on diuretic in the past. Treated with lisinopril 20 mg daily for hypertension since November 2011. Takes Fosamax.  Patient had 2-D echocardiogram in 2010 because of murmur. She had mildly calcified aortic valve leaflets consistent with aortic sclerosis. No SBE prophylaxis needed.  A compound fracture of her toes in 1960. Tonsillectomy 1954.  Social history: She is a native of [place] but has lived in [place] for over 29 years. 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 College degree. Does not smoke. Social alcohol consumption. She walks daily.  Family history: Father died at age 82 of cancer. One brother and 2 sisters in good health.  Osteoporosis diagnosed in 2010 by bone density study.  Colonoscopy done by Dr. Laural Benes in 2008.  Tetanus immunization in 2008. Zostavax vaccine 2010. Given Prevnar and flu vaccine today.  Is intolerant of Benadryl it causes her to be hyperactive.  Patient saw Dr. Hazelton Callas in May 2013 Re: Eosinophilia. He recommended count Se[place] 6-12 months. We've been following this for a number of years. Allergy testing done May 2013 was negative. All foods tested for allergy were negative. Chest x-ray was negative.    Review of Systems  Constitutional: Negative.   All other systems reviewed and are negative.      Objective:   Physical Exam  Constitutional: She is oriented to person, place, and time. She appears well-developed and well-nourished. No distress.  HENT:  Head: Normocephalic and atraumatic.  Right Ear: External ear normal.  Left Ear: External ear normal.  Mouth/Throat: Oropharynx is clear and moist. No oropharyngeal  exudate.  Eyes: Conjunctivae and EOM are normal. Pupils are equal, round, and reactive to light. Right eye exhibits no discharge. Left eye exhibits no discharge. No scleral icterus.  Neck: Neck supple. No JVD present. No thyromegaly present.  Cardiovascular: Normal rate, regular rhythm and intact distal pulses.   Murmur heard. 1/6 systolic ejection murmur  Pulmonary/Chest: Breath sounds normal. No respiratory distress. She has no rales. She exhibits no tenderness.  Abdominal: Soft. Bowel sounds are normal. She exhibits no distension and no mass. There is no tenderness. There is no rebound and no guarding.  Genitourinary:  Pap deferred. Bimanual normal.  Musculoskeletal: Normal range of motion. She exhibits no edema.  Lymphadenopathy:    She has no cervical adenopathy.  Neurological: She is alert and oriented to person, place, and time. She has normal reflexes. No cranial nerve deficit.  Skin: Skin is warm and dry. No rash noted. She is not diaphoretic.  Psychiatric: She has a normal mood and affect. Her behavior is normal. Judgment and thought content normal.  Vitals reviewed.         Assessment & Plan:  Essential hypertension  Aortic sclerosis  Eosinophilia  Osteoporosis  Plan: Return in one year or as needed. Flu and Prevnar vaccines given. Had pneumococcal 23 in 2014. Had TD happened 2008. Has had Zostavax vaccine. Continue same medications. Recommend annual mammogram.  Her potassium is elevated today at 5.5 and I think that is an error. It is repeated today  Addendum: Potassium is 4.4. Suspect initial specimen was hemolyzed.  Subjective:   Patient presents for Medicare Annual/Subsequent preventive examination.  Review  Past Medical/Family/Social: See above   Risk Factors  Current exercise habits: Stays active Dietary issues discussed: Low fat low carbohydrate  Cardiac risk factors: Hypertension  Depression Screen  (Note: if answer to either of the following is  "Yes", a more complete depression screening is indicated)   Over the past two weeks, have you felt down, depressed or hopeless? No  Over the past two weeks, have you felt little interest or pleasure in doing things? No Have you lost interest or pleasure in daily life? No Do you often feel hopeless? No Do you cry easily over simple problems? No   Activities of Daily Living  In your present state of health, do you have any difficulty performing the following activities?:   Driving? No  Managing money? No  Feeding yourself? No  Getting from bed to chair? No  Climbing a flight of stairs? No  Preparing food and eating?: No  Bathing or showering? No  Getting dressed: No  Getting to the toilet? No  Using the toilet:No  Moving around from place to place: No  In the past year have you fallen or had a near fall?:No  Are you sexually active? No  Do you have more than one partner? No   Hearing Difficulties: No  Do you often ask people to speak up or repeat themselves? No  Do you experience ringing or noises in your ears? No  Do you have difficulty understanding soft or whispered voices? No  Do you feel that you have a problem with memory? No Do you often misplace items? No    Home Safety:  Do you have a smoke alarm at your residence? Yes Do you have grab bars in the bathroom? Yes Do you have throw rugs in your house? Yes   Cognitive Testing  Alert? Yes Normal Appearance?Yes  Oriented to person? Yes Place? Yes  Time? Yes  Recall of three objects? Yes  Can perform simple calculations? Yes  Displays appropriate judgment?Yes  Can read the correct time from a watch face?Yes   List the Names of Other Physician/Practitioners you currently use:  See referral list for the physicians patient is currently seeing.     Review of Systems: See above   Objective:     General appearance: Appears stated age  Head: Normocephalic, without obvious abnormality, atraumatic  Eyes: conj  clear, EOMi PEERLA  Ears: normal TM's and external ear canals both ears  Nose: Nares normal. Septum midline. Mucosa normal. No drainage or sinus tenderness.  Throat: lips, mucosa, and tongue normal; teeth and gums normal  Neck: no adenopathy, no carotid bruit, no JVD, supple, symmetrical, trachea midline and thyroid not enlarged, symmetric, no tenderness/mass/nodules  No CVA tenderness.  Lungs: clear to auscultation bilaterally  Breasts: normal appearance, no masses or tenderness Heart: regular rate and rhythm, S1, S2 normal, no murmur, click, rub or gallop  Abdomen: soft, non-tender; bowel sounds normal; no masses, no organomegaly  Musculoskeletal: ROM normal in all joints, no crepitus, no deformity, Normal muscle strengthen. Back  is symmetric, no curvature. Skin: Skin color, texture, turgor normal. No rashes or lesions  Lymph nodes: Cervical, supraclavicular, and axillary nodes normal.  Neurologic: CN 2 -12 Normal, Normal symmetric reflexes. Normal coordination and gait  Psych: Alert & Oriented x 3, Mood appear stable.    Assessment:    Annual wellness medicare exam   Plan:    During the course of the visit the patient was educated and counseled about appropriate screening and preventive  services including:   Annual mammogram  Annual flu vaccine     Patient Instructions (the written plan) was given to the patient.  Medicare Attestation  I have personally reviewed:  The patient's medical and social history  Their use of alcohol, tobacco or illicit drugs  Their current medications and supplements  The patient's functional ability including ADLs,fall risks, home safety risks, cognitive, and hearing and visual impairment  Diet and physical activities  Evidence for depression or mood disorders  The patient's weight, height, BMI, and visual acuity have been recorded in the chart. I have made referrals, counseling, and provided education to the patient based on review of the  above and I have provided the patient with a written personalized care plan for preventive services.

## 2015-08-09 NOTE — Patient Instructions (Signed)
Continue same medications and return in one year. It was a pleasure to see you today. 

## 2015-08-10 ENCOUNTER — Other Ambulatory Visit: Payer: Self-pay

## 2015-08-10 DIAGNOSIS — Z1231 Encounter for screening mammogram for malignant neoplasm of breast: Secondary | ICD-10-CM

## 2015-08-19 ENCOUNTER — Other Ambulatory Visit: Payer: Self-pay

## 2015-08-19 MED ORDER — ALENDRONATE SODIUM 70 MG PO TABS: ORAL_TABLET | ORAL | Status: DC

## 2015-08-25 ENCOUNTER — Ambulatory Visit
Admission: RE | Admit: 2015-08-25 | Discharge: 2015-08-25 | Disposition: A | Discharge status: Home / Self Care | Payer: Medicare Other | Source: Ambulatory Visit

## 2015-08-25 DIAGNOSIS — Z1231 Encounter for screening mammogram for malignant neoplasm of breast: Secondary | ICD-10-CM

## 2016-01-06 ENCOUNTER — Other Ambulatory Visit: Payer: Self-pay

## 2016-01-06 MED ORDER — LISINOPRIL 20 MG PO TABS: 20.0000 mg | ORAL_TABLET | Freq: Every day | ORAL | Status: DC

## 2016-02-03 ENCOUNTER — Other Ambulatory Visit: Payer: Self-pay | Admitting: Internal Medicine

## 2016-03-18 DIAGNOSIS — X509XXA Other and unspecified overexertion or strenuous movements or postures, initial encounter: Secondary | ICD-10-CM | POA: Diagnosis not present

## 2016-03-18 DIAGNOSIS — S93602A Unspecified sprain of left foot, initial encounter: Secondary | ICD-10-CM | POA: Diagnosis not present

## 2016-03-18 DIAGNOSIS — M79672 Pain in left foot: Secondary | ICD-10-CM | POA: Diagnosis not present

## 2016-03-18 DIAGNOSIS — I10 Essential (primary) hypertension: Secondary | ICD-10-CM | POA: Diagnosis not present

## 2016-03-28 DIAGNOSIS — H1851 Endothelial corneal dystrophy: Secondary | ICD-10-CM | POA: Diagnosis not present

## 2016-03-28 DIAGNOSIS — H02833 Dermatochalasis of right eye, unspecified eyelid: Secondary | ICD-10-CM | POA: Diagnosis not present

## 2016-03-28 DIAGNOSIS — H04123 Dry eye syndrome of bilateral lacrimal glands: Secondary | ICD-10-CM | POA: Diagnosis not present

## 2016-03-28 DIAGNOSIS — H35033 Hypertensive retinopathy, bilateral: Secondary | ICD-10-CM | POA: Diagnosis not present

## 2016-03-28 DIAGNOSIS — Z961 Presence of intraocular lens: Secondary | ICD-10-CM | POA: Diagnosis not present

## 2016-04-23 ENCOUNTER — Other Ambulatory Visit: Payer: Self-pay | Admitting: Internal Medicine

## 2016-06-09 DIAGNOSIS — L821 Other seborrheic keratosis: Secondary | ICD-10-CM | POA: Diagnosis not present

## 2016-07-17 ENCOUNTER — Other Ambulatory Visit: Payer: Self-pay | Admitting: Internal Medicine

## 2016-07-28 ENCOUNTER — Other Ambulatory Visit: Payer: Self-pay | Admitting: Internal Medicine

## 2016-08-07 ENCOUNTER — Other Ambulatory Visit: Payer: PPO | Admitting: Internal Medicine

## 2016-08-07 DIAGNOSIS — I10 Essential (primary) hypertension: Secondary | ICD-10-CM | POA: Diagnosis not present

## 2016-08-07 DIAGNOSIS — M810 Age-related osteoporosis without current pathological fracture: Secondary | ICD-10-CM | POA: Diagnosis not present

## 2016-08-07 LAB — LIPID PANEL
Cholesterol: 214 mg/dL — ABNORMAL HIGH (ref 125–200)
HDL: 83 mg/dL (ref >=46)
LDL CALC: 117 mg/dL (ref <130)
TRIGLYCERIDES: 68 mg/dL (ref <150)
Total CHOL/HDL Ratio: 2.6 Ratio (ref <=5.0)
VLDL: 14 mg/dL (ref <30)

## 2016-08-07 LAB — CBC WITH DIFFERENTIAL/PLATELET
BASOS PCT: 1 %
Basophils Absolute: 50 cells/uL (ref 0–200)
Eosinophils Absolute: 550 cells/uL — ABNORMAL HIGH (ref 15–500)
Eosinophils Relative: 11 %
HEMATOCRIT: 45.1 % — AB (ref 35.0–45.0)
HEMOGLOBIN: 14.9 g/dL (ref 11.7–15.5)
Lymphocytes Relative: 31 %
Lymphs Abs: 1550 cells/uL (ref 850–3900)
MCH: 31.1 pg (ref 27.0–33.0)
MCHC: 33 g/dL (ref 32.0–36.0)
MCV: 94.2 fL (ref 80.0–100.0)
MONO ABS: 300 {cells}/uL (ref 200–950)
MPV: 9.3 fL (ref 7.5–12.5)
Monocytes Relative: 6 %
NEUTROS ABS: 2550 {cells}/uL (ref 1500–7800)
Neutrophils Relative %: 51 %
Platelets: 245 10*3/uL (ref 140–400)
RBC: 4.79 MIL/uL (ref 3.80–5.10)
RDW: 14.2 % (ref 11.0–15.0)
WBC: 5 10*3/uL (ref 3.8–10.8)

## 2016-08-07 LAB — COMPREHENSIVE METABOLIC PANEL
ALBUMIN: 4.3 g/dL (ref 3.6–5.1)
ALK PHOS: 30 U/L — AB (ref 33–130)
ALT: 11 U/L (ref 6–29)
AST: 17 U/L (ref 10–35)
BUN: 22 mg/dL (ref 7–25)
CALCIUM: 9.8 mg/dL (ref 8.6–10.4)
CO2: 28 mmol/L (ref 20–31)
Chloride: 96 mmol/L — ABNORMAL LOW (ref 98–110)
Creat: 0.82 mg/dL (ref 0.50–0.99)
Glucose, Bld: 88 mg/dL (ref 65–99)
POTASSIUM: 4.7 mmol/L (ref 3.5–5.3)
Sodium: 133 mmol/L — ABNORMAL LOW (ref 135–146)
TOTAL PROTEIN: 7.3 g/dL (ref 6.1–8.1)
Total Bilirubin: 0.5 mg/dL (ref 0.2–1.2)

## 2016-08-07 LAB — TSH: TSH: 3.88 m[IU]/L

## 2016-08-08 LAB — VITAMIN D 25 HYDROXY (VIT D DEFICIENCY, FRACTURES): VIT D 25 HYDROXY: 49 ng/mL (ref 30–100)

## 2016-08-10 ENCOUNTER — Encounter: Payer: Self-pay | Admitting: Internal Medicine

## 2016-08-10 ENCOUNTER — Ambulatory Visit (INDEPENDENT_AMBULATORY_CARE_PROVIDER_SITE_OTHER): Payer: PPO | Admitting: Internal Medicine

## 2016-08-10 ENCOUNTER — Other Ambulatory Visit: Payer: Self-pay | Admitting: Internal Medicine

## 2016-08-10 VITALS — BP 130/80 | HR 72 | Temp 97.9°F | Resp 12 | Ht 64.0 in | Wt 131.0 lb

## 2016-08-10 DIAGNOSIS — D721 Eosinophilia, unspecified: Secondary | ICD-10-CM

## 2016-08-10 DIAGNOSIS — Z23 Encounter for immunization: Secondary | ICD-10-CM

## 2016-08-10 DIAGNOSIS — Z0000 Encounter for general adult medical examination without abnormal findings: Secondary | ICD-10-CM

## 2016-08-10 DIAGNOSIS — M810 Age-related osteoporosis without current pathological fracture: Secondary | ICD-10-CM

## 2016-08-10 DIAGNOSIS — Z1231 Encounter for screening mammogram for malignant neoplasm of breast: Secondary | ICD-10-CM

## 2016-08-10 DIAGNOSIS — I10 Essential (primary) hypertension: Secondary | ICD-10-CM | POA: Diagnosis not present

## 2016-08-10 LAB — POCT URINALYSIS DIPSTICK
Bilirubin, UA: NEGATIVE
Blood, UA: NEGATIVE
Glucose, UA: NEGATIVE
Ketones, UA: NEGATIVE
LEUKOCYTES UA: NEGATIVE
Nitrite, UA: NEGATIVE
PROTEIN UA: NEGATIVE
UROBILINOGEN UA: NEGATIVE
pH, UA: 6.5

## 2016-08-10 MED ORDER — ALENDRONATE SODIUM 70 MG PO TABS: 70.0000 mg | ORAL_TABLET | ORAL | 3 refills | Status: DC

## 2016-08-10 MED ORDER — LISINOPRIL 20 MG PO TABS: ORAL_TABLET | ORAL | 3 refills | Status: DC

## 2016-08-10 NOTE — Progress Notes (Signed)
Subjective:    Patient ID: Peggy Nelson, female    DOB: 01/18/1946, 70 y.o.   MRN: [id number]  HPI  70 year old Female for health maintenance exam and evaluation of medical issues including hypertension and Osteoporosis. Last bone density study was in 2015 showing a T score in the left femoral neck of -1.9.and -3.6 in the left forearm  Mammogram appointment November 21.  Blood pressure on arrival was 148/52 but was repeated later during the visit and was 130/80.  She has a history of aortic sclerosis, eosinophilia. She has been hyponatremic on diuretic in the past. Has been treated with lisinopril 20 mg daily for hypertension since November 2011. Takes Fosamax.  She had a 2-D echocardiogram in 2010 because of cardiac murmur. She had  mildly calcified aortic valve leaflets consistent with aortic sclerosis. No SBE prophylaxis needed.  Compound fracture of her toes in 1960. Tonsillectomy 1954.  Social history: She is a native of [place] but has lived in [place] for over 29 years. 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 She is a widow. College degree. Does not smoke. Social alcohol consumption. She walks daily. She is now retired.  Family history: Father died at age 69 of cancer. One brother and 2 sisters in good health.  Osteoporosis diagnosed in 2010 by bone density study with T score of -4.2 in the left forearm and -2.3 in the left femoral neck. By 2012, bone density study was -4.1 in the left forearm and -2.4 in the left femoral neck. By 2015, T score was -1.9 in the left femoral neck -3.6 in the left forearm. Clearly Fosamax has helped her.  Colonoscopy done by Dr. Laural BenesJohnson in 2008.  Intolerant of Benadryl-it causes her to be hyperactive.  Patient saw Dr. Newcastle CallasSharma in May 2013 Re: Eosinophilia. He recommended checking white blood cell count differential every 6-12 months. We've been following this for a number of years. Allergy testing done May 2013 was negative. All  foods tested for allergy were negative. Chest x-ray was negative.        Review of Systems  Constitutional: Negative.   All other systems reviewed and are negative.      Objective:   Physical Exam  Constitutional: She is oriented to person, place, and time. She appears well-developed and well-nourished. No distress.  HENT:  Head: Normocephalic and atraumatic.  Right Ear: External ear normal.  Left Ear: External ear normal.  Mouth/Throat: Oropharynx is clear and moist. No oropharyngeal exudate.  Eyes: Conjunctivae and EOM are normal. Right eye exhibits no discharge. Left eye exhibits no discharge. No scleral icterus.  Neck: Neck supple. No JVD present. No thyromegaly present.  Cardiovascular: Normal rate, regular rhythm, normal heart sounds and intact distal pulses.   No murmur heard. Pulmonary/Chest: No respiratory distress. She has no wheezes. She has no rales.  Breasts normal female  Abdominal: She exhibits no distension and no mass. There is no tenderness. There is no rebound and no guarding.  Genitourinary:  Genitourinary Comments: Pap deferred due to age. Bimanual normal.  Musculoskeletal: She exhibits no edema.  Lymphadenopathy:    She has no cervical adenopathy.  Neurological: She is alert and oriented to person, place, and time. She has normal reflexes. No cranial nerve deficit. Coordination normal.  Skin: Skin is warm and dry. No rash noted. She is not diaphoretic.  Psychiatric: She has a normal mood and affect. Her behavior is normal. Judgment and thought content normal.  Vitals reviewed.  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 This year she has 550 cells per microliter normal being between 15 and 500. Percent of relative eosinophils is actually within normal limits at 11%  Total cholesterol is 214 but she has a high HDL of 83 and a normal LDL of 117.  Triglycerides are normal.  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  Mild hyponatremia-we just follow this.  Her potassium is normal at 4.7  Plan: Patient is to return in one year or as needed. Continue same medications.  Subjective:   Patient presents for Medicare Annual/Subsequent preventive examination.  Review Past Medical/Family/Social:See above   Risk Factors  Current exercise habits: Walks daily Dietary issues discussed: Low fat low carbohydrate  Cardiac risk factors: None except hypertension  Depression Screen  (Note: if answer to either of the following is "Yes", a more complete depression screening is indicated)   Over the past two weeks, have you felt down, depressed or hopeless? No  Over the past two weeks, have you felt little interest or pleasure in doing things? No Have you lost interest or pleasure in daily life? No Do you often feel hopeless? No Do you cry easily over simple problems? No   Activities of Daily Living  In your present state of health, do you have any difficulty performing the following activities?:   Driving? No  Managing money? No  Feeding yourself? No  Getting from bed to chair? No  Climbing a flight of stairs? No  Preparing food and eating?: No  Bathing or showering? No  Getting dressed: No  Getting to the toilet? No  Using the toilet:No  Moving around from place to place: No  In the past year have you fallen or had a near fall?:No  Are you sexually active? No  Do you have more than one partner? No   Hearing Difficulties: No  Do you often ask people to speak up or repeat themselves? No  Do you experience ringing or noises in your ears? No  Do you have difficulty understanding soft or whispered voices? No  Do you feel that you have a problem with memory? No Do you often misplace items? No    Home Safety:  Do you have a smoke alarm at your residence? Yes Do you have grab bars in the bathroom?Yes Do you  have throw rugs in your house? Yes   Cognitive Testing  Alert? Yes Normal Appearance?Yes  Oriented to person? Yes Place? Yes  Time? Yes  Recall of three objects? Yes  Can perform simple calculations? Yes  Displays appropriate judgment?Yes  Can read the correct time from a watch face?Yes   List the Names of Other Physician/Practitioners you currently use:  See referral list for the physicians patient is currently seeing.     Review of Systems: See above   Objective:     General appearance: Appears Younger than stated age Head: Normocephalic, without obvious abnormality, atraumatic  Eyes: conj clear, EOMi PEERLA  Ears: normal TM's and external ear canals both ears  Nose: Nares normal. Septum midline. Mucosa normal. No drainage or sinus tenderness.  Throat: lips, mucosa, and tongue normal; teeth and gums normal  Neck: no adenopathy, no carotid bruit, no JVD, supple, symmetrical, trachea midline and thyroid not enlarged, symmetric, no tenderness/mass/nodules  No CVA tenderness.  Lungs: clear to auscultation bilaterally  Breasts: normal appearance, no masses or tenderness Heart:  regular rate and rhythm, S1, S2 normal, no murmur, click, rub or gallop  Abdomen: soft, non-tender; bowel sounds normal; no masses, no organomegaly  Musculoskeletal: ROM normal in all joints, no crepitus, no deformity, Normal muscle strengthen. Back  is symmetric, no curvature. Skin: Skin color, texture, turgor normal. No rashes or lesions  Lymph nodes: Cervical, supraclavicular, and axillary nodes normal.  Neurologic: CN 2 -12 Normal, Normal symmetric reflexes. Normal coordination and gait  Psych: Alert & Oriented x 3, Mood appear stable.    Assessment:    Annual wellness medicare exam   Plan:    During the course of the visit the patient was educated and counseled about appropriate screening and preventive services including:  Annual mammogram  Bone density study every 2 years  Annual flu  vaccine recommended      Patient Instructions (the written plan) was given to the patient.  Medicare Attestation  I have personally reviewed:  The patient's medical and social history  Their use of alcohol, tobacco or illicit drugs  Their current medications and supplements  The patient's functional ability including ADLs,fall risks, home safety risks, cognitive, and hearing and visual impairment  Diet and physical activities  Evidence for depression or mood disorders  The patient's weight, height, BMI, and visual acuity have been recorded in the chart. I have made referrals, counseling, and provided education to the patient based on review of the above and I have provided the patient with a written personalized care plan for preventive services.

## 2016-08-10 NOTE — Patient Instructions (Signed)
It was a pleasure to see you today. Continue same medications and return in one year. Flu vaccine given.

## 2016-08-29 ENCOUNTER — Ambulatory Visit
Admission: RE | Admit: 2016-08-29 | Discharge: 2016-08-29 | Disposition: A | Discharge status: Home / Self Care | Payer: PPO | Source: Ambulatory Visit | Attending: Internal Medicine | Admitting: Internal Medicine

## 2016-08-29 DIAGNOSIS — Z1231 Encounter for screening mammogram for malignant neoplasm of breast: Secondary | ICD-10-CM

## 2017-04-09 ENCOUNTER — Encounter: Payer: Self-pay | Admitting: Internal Medicine

## 2017-04-09 DIAGNOSIS — H04123 Dry eye syndrome of bilateral lacrimal glands: Secondary | ICD-10-CM | POA: Diagnosis not present

## 2017-04-09 DIAGNOSIS — Z961 Presence of intraocular lens: Secondary | ICD-10-CM | POA: Diagnosis not present

## 2017-04-09 DIAGNOSIS — H35033 Hypertensive retinopathy, bilateral: Secondary | ICD-10-CM | POA: Diagnosis not present

## 2017-04-09 DIAGNOSIS — H35363 Drusen (degenerative) of macula, bilateral: Secondary | ICD-10-CM | POA: Diagnosis not present

## 2017-08-07 ENCOUNTER — Other Ambulatory Visit: Payer: Self-pay | Admitting: Internal Medicine

## 2017-08-07 DIAGNOSIS — Z1231 Encounter for screening mammogram for malignant neoplasm of breast: Secondary | ICD-10-CM

## 2017-08-09 ENCOUNTER — Other Ambulatory Visit: Payer: PPO | Admitting: Internal Medicine

## 2017-08-09 DIAGNOSIS — I10 Essential (primary) hypertension: Secondary | ICD-10-CM | POA: Diagnosis not present

## 2017-08-09 DIAGNOSIS — Z0000 Encounter for general adult medical examination without abnormal findings: Secondary | ICD-10-CM | POA: Diagnosis not present

## 2017-08-09 DIAGNOSIS — D721 Eosinophilia, unspecified: Secondary | ICD-10-CM

## 2017-08-09 LAB — COMPLETE METABOLIC PANEL WITH GFR
AG Ratio: 1.4 (calc) (ref 1.0–2.5)
ALKALINE PHOSPHATASE (APISO): 33 U/L (ref 33–130)
ALT: 8 U/L (ref 6–29)
AST: 14 U/L (ref 10–35)
Albumin: 4.1 g/dL (ref 3.6–5.1)
BUN: 19 mg/dL (ref 7–25)
CHLORIDE: 93 mmol/L — AB (ref 98–110)
CO2: 28 mmol/L (ref 20–32)
Calcium: 9.4 mg/dL (ref 8.6–10.4)
Creat: 0.85 mg/dL (ref 0.50–0.99)
GFR, EST AFRICAN AMERICAN: 81 mL/min/{1.73_m2} (ref >=60)
GFR, Est Non African American: 70 mL/min/{1.73_m2} (ref >=60)
Globulin: 2.9 g/dL (calc) (ref 1.9–3.7)
Glucose, Bld: 88 mg/dL (ref 65–99)
POTASSIUM: 5.1 mmol/L (ref 3.5–5.3)
Sodium: 128 mmol/L — ABNORMAL LOW (ref 135–146)
Total Bilirubin: 0.7 mg/dL (ref 0.2–1.2)
Total Protein: 7 g/dL (ref 6.1–8.1)

## 2017-08-09 LAB — CBC WITH DIFFERENTIAL/PLATELET
BASOS ABS: 29 {cells}/uL (ref 0–200)
Basophils Relative: 0.6 %
EOS PCT: 9.2 %
Eosinophils Absolute: 442 cells/uL (ref 15–500)
HCT: 40.1 % (ref 35.0–45.0)
HEMOGLOBIN: 13.6 g/dL (ref 11.7–15.5)
Lymphs Abs: 1526 cells/uL (ref 850–3900)
MCH: 31.1 pg (ref 27.0–33.0)
MCHC: 33.9 g/dL (ref 32.0–36.0)
MCV: 91.8 fL (ref 80.0–100.0)
MPV: 9.3 fL (ref 7.5–12.5)
Monocytes Relative: 7.9 %
NEUTROS ABS: 2424 {cells}/uL (ref 1500–7800)
NEUTROS PCT: 50.5 %
Platelets: 225 10*3/uL (ref 140–400)
RBC: 4.37 10*6/uL (ref 3.80–5.10)
RDW: 12.2 % (ref 11.0–15.0)
Total Lymphocyte: 31.8 %
WBC mixed population: 379 cells/uL (ref 200–950)
WBC: 4.8 10*3/uL (ref 3.8–10.8)

## 2017-08-09 LAB — LIPID PANEL
Cholesterol: 187 mg/dL (ref <200)
HDL: 106 mg/dL (ref >50)
LDL Cholesterol (Calc): 68 mg/dL (calc)
Non-HDL Cholesterol (Calc): 81 mg/dL (calc) (ref <130)
Total CHOL/HDL Ratio: 1.8 (calc) (ref <5.0)
Triglycerides: 44 mg/dL (ref <150)

## 2017-08-09 LAB — TSH: TSH: 2.29 m[IU]/L (ref 0.40–4.50)

## 2017-08-13 ENCOUNTER — Ambulatory Visit: Payer: PPO | Admitting: Internal Medicine

## 2017-08-13 ENCOUNTER — Encounter: Payer: Self-pay | Admitting: Internal Medicine

## 2017-08-13 VITALS — BP 136/78 | HR 77 | Temp 98.2°F | Ht 64.5 in | Wt 132.0 lb

## 2017-08-13 DIAGNOSIS — I10 Essential (primary) hypertension: Secondary | ICD-10-CM | POA: Diagnosis not present

## 2017-08-13 DIAGNOSIS — Z23 Encounter for immunization: Secondary | ICD-10-CM

## 2017-08-13 DIAGNOSIS — D721 Eosinophilia, unspecified: Secondary | ICD-10-CM

## 2017-08-13 DIAGNOSIS — Z0000 Encounter for general adult medical examination without abnormal findings: Secondary | ICD-10-CM

## 2017-08-13 DIAGNOSIS — M810 Age-related osteoporosis without current pathological fracture: Secondary | ICD-10-CM

## 2017-08-13 DIAGNOSIS — I358 Other nonrheumatic aortic valve disorders: Secondary | ICD-10-CM

## 2017-08-13 DIAGNOSIS — E871 Hypo-osmolality and hyponatremia: Secondary | ICD-10-CM

## 2017-08-13 LAB — POCT URINALYSIS DIPSTICK
Bilirubin, UA: NEGATIVE
Blood, UA: NEGATIVE
GLUCOSE UA: NEGATIVE
KETONES UA: NEGATIVE
Leukocytes, UA: NEGATIVE
Nitrite, UA: NEGATIVE
PROTEIN UA: NEGATIVE
Spec Grav, UA: 1.015 (ref 1.010–1.025)
Urobilinogen, UA: 0.2 E.U./dL
pH, UA: 5 (ref 5.0–8.0)

## 2017-08-13 NOTE — Progress Notes (Signed)
Subjective:    Patient ID: Peggy Nelson, female    DOB: [birth year], 71 y.o.   MRN: [id number]  HPI 71 year old Female for health maintenance exam, Medicare wellness and evaluation of medical issues.  Going for colonoscopy Wednesday at [facility].  History of hypertension and osteoporosis.  She takes Fosamax.  Had bone density study 2015 showing a T score in the left femoral neck of -1.9 and -3.6 in the left forearm.  Needs to have another study in the near future.  Schedule for mammogram November 26.  Given flu vaccine and Tdap vaccine today.  She has a history of aortic stenosis and eosinophilia.  She has been hyponatremic on diuretic in the past.  Has been treated with lisinopril 20 mg daily for hypertension since November 2011.  She had a 2D echocardiogram in 2010 because of cardiac murmur.  She had mildly calcified aortic valve leaflets consistent with aortic stenosis.  No SBE prophylaxis needed.  Compound fracture of her toes in 1960.  Tonsillectomy 1954.  Social history: She has lived in [place] for over 30 years.  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  She is a widow.  College degree.  Does not smoke.  Social alcohol consumption.  She is a native of [place].  Family history: Father died at age 62 of cancer.  One brother and 2 sisters in good health.  Osteoporosis was diagnosed in 2010 a bone density study with a T score of -4.2 in the left forearm and -2.3 in the left femoral neck.  11/13/2010, bone density study was -4.1 in the left forearm and -2.4 in the left femoral neck.  By 2015 T score was -1.9 in the left femoral neck and -3.6 in the left forearm.  Clearly Fosamax is helped her.  Colonoscopy done by Dr. Laural Benes in 2008 and is to be repeated in the near future.  Intolerant of Benadryl causes her to be hyperactive.  Patient saw Dr. Stella Callas in May 2013 regarding eosinophilia.  He recommended checking white blood cell count  differential every 6-12 months which we have been doing and is stable.  Allergy testing done May 2013 was negative.  All foods tested for allergy were negative.  Chest x-ray was negative.    Review of Systems  Constitutional: Negative.   HENT: Negative.   Respiratory: Negative.   Genitourinary: Negative.   Musculoskeletal: Negative.   Neurological: Negative.   Hematological: Negative.   Psychiatric/Behavioral: Negative.        Objective:   Physical Exam  Constitutional: She is oriented to person, place, and time. She appears well-developed and well-nourished. No distress.  HENT:  Head: Normocephalic and atraumatic.  Right Ear: External ear normal.  Left Ear: External ear normal.  Mouth/Throat: Oropharynx is clear and moist. No oropharyngeal exudate.  Eyes: Right eye exhibits no discharge. Left eye exhibits no discharge. No scleral icterus.  Neck: Neck supple. No JVD present. No thyromegaly present.  Cardiovascular: Normal rate, regular rhythm and intact distal pulses.  1/6 systolic ejection murmur  Pulmonary/Chest: Effort normal and breath sounds normal. No respiratory distress. She has no wheezes. She has no rales. She exhibits no tenderness.  Abdominal: Soft. Bowel sounds are normal. She exhibits no mass. There is no tenderness. There is no rebound and no guarding.  Genitourinary:  Genitourinary Comments: Pap deferred due to age.  Bimanual normal.  Musculoskeletal: She exhibits no edema.  Neurological: She is alert and oriented to person,  place, and time. She has normal reflexes. No cranial nerve deficit. Coordination normal.  Skin: Skin is warm and dry. No rash noted. She is not diaphoretic. No erythema.  Psychiatric: She has a normal mood and affect. Her behavior is normal. Judgment and thought content normal.  Vitals reviewed.         Assessment & Plan:  Normal health maintenance exam  Osteoporosis-treated with Fosamax.  She should have a bone density study in the  near future  Essential hypertension stable on current regimen  History of eosinophilia-stable  History of aortic sclerosis-no SBE prophylaxis needed  History of mild hyponatremia-1 year ago sodium was 133 and is now 128 but she is asymptomatic.  Chloride is also low consistent with mild volume depletion.  She has had this for some time and we simply follow it.  Subjective:   Patient presents for Medicare Annual/Subsequent preventive examination.  Review Past Medical/Family/Social: See above   Risk Factors  Current exercise habits: walks daily Dietary issues discussed: Low-fat low carbohydrate  Cardiac risk factors: None, history of hypertension  Depression Screen  (Note: if answer to either of the following is "Yes", a more complete depression screening is indicated)   Over the past two weeks, have you felt down, depressed or hopeless? No  Over the past two weeks, have you felt little interest or pleasure in doing things? No Have you lost interest or pleasure in daily life? No Do you often feel hopeless? No Do you cry easily over simple problems? No   Activities of Daily Living  In your present state of health, do you have any difficulty performing the following activities?:   Driving? No  Managing money? No  Feeding yourself? No  Getting from bed to chair? No  Climbing a flight of stairs? No  Preparing food and eating?: No  Bathing or showering? No  Getting dressed: No  Getting to the toilet? No  Using the toilet:No  Moving around from place to place: No  In the past year have you fallen or had a near fall?:No  Are you sexually active? No  Do you have more than one partner? No   Hearing Difficulties: No  Do you often ask people to speak up or repeat themselves? No  Do you experience ringing or noises in your ears? No  Do you have difficulty understanding soft or whispered voices? No  Do you feel that you have a problem with memory? No Do you often misplace  items? No    Home Safety:  Do you have a smoke alarm at your residence? Yes Do you have grab bars in the bathroom?  Yes  He has do you have throw rugs in your house?   Cognitive Testing  Alert? Yes Normal Appearance?Yes  Oriented to person? Yes Place? Yes  Time? Yes  Recall of three objects? Yes  Can perform simple calculations? Yes  Displays appropriate judgment?Yes  Can read the correct time from a watch face?Yes   List the Names of Other Physician/Practitioners you currently use:  See referral list for the physicians patient is currently seeing.     Review of Systems: See above   Objective:     General appearance: Appears stated age  Head: Normocephalic, without obvious abnormality, atraumatic  Eyes: conj clear, EOMi PEERLA  Ears: normal TM's and external ear canals both ears  Nose: Nares normal. Septum midline. Mucosa normal. No drainage or sinus tenderness.  Throat: lips, mucosa, and tongue normal; teeth and gums  normal  Neck: no adenopathy, no carotid bruit, no JVD, supple, symmetrical, trachea midline and thyroid not enlarged, symmetric, no tenderness/mass/nodules  No CVA tenderness.  Lungs: clear to auscultation bilaterally  Breasts: normal appearance, no masses or tenderness Heart: regular rate and rhythm, S1, S2 normal.  1/6 systolic ejection murmur Abdomen: soft, non-tender; bowel sounds normal; no masses, no organomegaly  Musculoskeletal: ROM normal in all joints, no crepitus, no deformity, Normal muscle strengthen. Back  is symmetric, no curvature. Skin: Skin color, texture, turgor normal. No rashes or lesions  Lymph nodes: Cervical, supraclavicular, and axillary nodes normal.  Neurologic: CN 2 -12 Normal, Normal symmetric reflexes. Normal coordination and gait  Psych: Alert & Oriented x 3, Mood appear stable.    Assessment:    Annual wellness medicare exam   Plan:    During the course of the visit the patient was educated and counseled about  appropriate screening and preventive services including:   Annual mammogram  Recommend bone density study     Patient Instructions (the written plan) was given to the patient.  Medicare Attestation  I have personally reviewed:  The patient's medical and social history  Their use of alcohol, tobacco or illicit drugs  Their current medications and supplements  The patient's functional ability including ADLs,fall risks, home safety risks, cognitive, and hearing and visual impairment  Diet and physical activities  Evidence for depression or mood disorders  The patient's weight, height, BMI, and visual acuity have been recorded in the chart. I have made referrals, counseling, and provided education to the patient based on review of the above and I have provided the patient with a written personalized care plan for preventive services.

## 2017-08-15 ENCOUNTER — Encounter: Payer: Self-pay | Admitting: Internal Medicine

## 2017-08-15 DIAGNOSIS — Z1211 Encounter for screening for malignant neoplasm of colon: Secondary | ICD-10-CM | POA: Diagnosis not present

## 2017-08-15 DIAGNOSIS — Q438 Other specified congenital malformations of intestine: Secondary | ICD-10-CM | POA: Diagnosis not present

## 2017-08-15 DIAGNOSIS — K635 Polyp of colon: Secondary | ICD-10-CM | POA: Diagnosis not present

## 2017-08-15 DIAGNOSIS — K644 Residual hemorrhoidal skin tags: Secondary | ICD-10-CM | POA: Diagnosis not present

## 2017-08-15 DIAGNOSIS — A630 Anogenital (venereal) warts: Secondary | ICD-10-CM | POA: Diagnosis not present

## 2017-08-15 DIAGNOSIS — K5730 Diverticulosis of large intestine without perforation or abscess without bleeding: Secondary | ICD-10-CM | POA: Diagnosis not present

## 2017-08-15 DIAGNOSIS — K648 Other hemorrhoids: Secondary | ICD-10-CM | POA: Diagnosis not present

## 2017-08-21 DIAGNOSIS — Z1211 Encounter for screening for malignant neoplasm of colon: Secondary | ICD-10-CM | POA: Diagnosis not present

## 2017-08-21 DIAGNOSIS — K635 Polyp of colon: Secondary | ICD-10-CM | POA: Diagnosis not present

## 2017-09-01 ENCOUNTER — Encounter: Payer: Self-pay | Admitting: Internal Medicine

## 2017-09-01 NOTE — Patient Instructions (Signed)
It was a pleasure to see you today.  Continue same medications.  Have annual mammogram.  Have bone density study.  Return in 1 year or as needed.

## 2017-09-03 ENCOUNTER — Ambulatory Visit
Admission: RE | Admit: 2017-09-03 | Discharge: 2017-09-03 | Disposition: A | Discharge status: Home / Self Care | Payer: PPO | Source: Ambulatory Visit | Attending: Internal Medicine | Admitting: Internal Medicine

## 2017-09-03 DIAGNOSIS — Z1231 Encounter for screening mammogram for malignant neoplasm of breast: Secondary | ICD-10-CM | POA: Diagnosis not present

## 2017-09-26 ENCOUNTER — Other Ambulatory Visit: Payer: Self-pay | Admitting: Internal Medicine

## 2017-10-21 ENCOUNTER — Other Ambulatory Visit: Payer: Self-pay | Admitting: Internal Medicine

## 2018-04-18 DIAGNOSIS — H35372 Puckering of macula, left eye: Secondary | ICD-10-CM | POA: Diagnosis not present

## 2018-04-18 DIAGNOSIS — H35363 Drusen (degenerative) of macula, bilateral: Secondary | ICD-10-CM | POA: Diagnosis not present

## 2018-04-18 DIAGNOSIS — H35033 Hypertensive retinopathy, bilateral: Secondary | ICD-10-CM | POA: Diagnosis not present

## 2018-04-18 DIAGNOSIS — Z961 Presence of intraocular lens: Secondary | ICD-10-CM | POA: Diagnosis not present

## 2018-04-19 ENCOUNTER — Encounter: Payer: Self-pay | Admitting: Internal Medicine

## 2018-08-12 ENCOUNTER — Other Ambulatory Visit: Payer: Self-pay | Admitting: Internal Medicine

## 2018-08-12 DIAGNOSIS — D721 Eosinophilia, unspecified: Secondary | ICD-10-CM

## 2018-08-12 DIAGNOSIS — I10 Essential (primary) hypertension: Secondary | ICD-10-CM

## 2018-08-12 DIAGNOSIS — I358 Other nonrheumatic aortic valve disorders: Secondary | ICD-10-CM

## 2018-08-12 DIAGNOSIS — M810 Age-related osteoporosis without current pathological fracture: Secondary | ICD-10-CM

## 2018-08-12 DIAGNOSIS — Z0000 Encounter for general adult medical examination without abnormal findings: Secondary | ICD-10-CM

## 2018-08-12 DIAGNOSIS — E871 Hypo-osmolality and hyponatremia: Secondary | ICD-10-CM

## 2018-08-13 ENCOUNTER — Other Ambulatory Visit: Payer: PPO | Admitting: Internal Medicine

## 2018-08-13 DIAGNOSIS — Z0000 Encounter for general adult medical examination without abnormal findings: Secondary | ICD-10-CM

## 2018-08-13 DIAGNOSIS — D721 Eosinophilia, unspecified: Secondary | ICD-10-CM

## 2018-08-13 DIAGNOSIS — I358 Other nonrheumatic aortic valve disorders: Secondary | ICD-10-CM

## 2018-08-13 DIAGNOSIS — I10 Essential (primary) hypertension: Secondary | ICD-10-CM | POA: Diagnosis not present

## 2018-08-13 DIAGNOSIS — E871 Hypo-osmolality and hyponatremia: Secondary | ICD-10-CM | POA: Diagnosis not present

## 2018-08-13 DIAGNOSIS — M810 Age-related osteoporosis without current pathological fracture: Secondary | ICD-10-CM

## 2018-08-13 LAB — CBC WITH DIFFERENTIAL/PLATELET
BASOS ABS: 42 {cells}/uL (ref 0–200)
Basophils Relative: 0.8 %
EOS ABS: 366 {cells}/uL (ref 15–500)
Eosinophils Relative: 6.9 %
HCT: 41 % (ref 35.0–45.0)
Hemoglobin: 13.8 g/dL (ref 11.7–15.5)
Lymphs Abs: 1394 cells/uL (ref 850–3900)
MCH: 31.7 pg (ref 27.0–33.0)
MCHC: 33.7 g/dL (ref 32.0–36.0)
MCV: 94.3 fL (ref 80.0–100.0)
MPV: 9.3 fL (ref 7.5–12.5)
Monocytes Relative: 9.6 %
NEUTROS PCT: 56.4 %
Neutro Abs: 2989 cells/uL (ref 1500–7800)
PLATELETS: 235 10*3/uL (ref 140–400)
RBC: 4.35 10*6/uL (ref 3.80–5.10)
RDW: 12.5 % (ref 11.0–15.0)
TOTAL LYMPHOCYTE: 26.3 %
WBC mixed population: 509 cells/uL (ref 200–950)
WBC: 5.3 10*3/uL (ref 3.8–10.8)

## 2018-08-13 LAB — COMPLETE METABOLIC PANEL WITH GFR
AG Ratio: 1.4 (calc) (ref 1.0–2.5)
ALBUMIN MSPROF: 4.3 g/dL (ref 3.6–5.1)
ALT: 10 U/L (ref 6–29)
AST: 17 U/L (ref 10–35)
Alkaline phosphatase (APISO): 33 U/L (ref 33–130)
BILIRUBIN TOTAL: 0.6 mg/dL (ref 0.2–1.2)
BUN: 22 mg/dL (ref 7–25)
CO2: 30 mmol/L (ref 20–32)
CREATININE: 0.93 mg/dL (ref 0.60–0.93)
Calcium: 10 mg/dL (ref 8.6–10.4)
Chloride: 96 mmol/L — ABNORMAL LOW (ref 98–110)
GFR, EST AFRICAN AMERICAN: 72 mL/min/{1.73_m2} (ref >=60)
GFR, Est Non African American: 62 mL/min/{1.73_m2} (ref >=60)
GLOBULIN: 3 g/dL (ref 1.9–3.7)
GLUCOSE: 102 mg/dL — AB (ref 65–99)
Potassium: 5.9 mmol/L — ABNORMAL HIGH (ref 3.5–5.3)
SODIUM: 132 mmol/L — AB (ref 135–146)
TOTAL PROTEIN: 7.3 g/dL (ref 6.1–8.1)

## 2018-08-13 LAB — LIPID PANEL
CHOLESTEROL: 210 mg/dL — AB (ref <200)
HDL: 97 mg/dL (ref >50)
LDL Cholesterol (Calc): 100 mg/dL (calc) — ABNORMAL HIGH
Non-HDL Cholesterol (Calc): 113 mg/dL (calc) (ref <130)
TRIGLYCERIDES: 51 mg/dL (ref <150)
Total CHOL/HDL Ratio: 2.2 (calc) (ref <5.0)

## 2018-08-13 LAB — TSH: TSH: 2.85 m[IU]/L (ref 0.40–4.50)

## 2018-08-15 ENCOUNTER — Encounter: Payer: Self-pay | Admitting: Internal Medicine

## 2018-08-15 ENCOUNTER — Other Ambulatory Visit: Payer: Self-pay | Admitting: Internal Medicine

## 2018-08-15 ENCOUNTER — Ambulatory Visit (INDEPENDENT_AMBULATORY_CARE_PROVIDER_SITE_OTHER): Payer: PPO | Admitting: Internal Medicine

## 2018-08-15 VITALS — BP 140/80 | HR 83 | Temp 98.2°F | Ht 65.0 in | Wt 133.0 lb

## 2018-08-15 DIAGNOSIS — M8580 Other specified disorders of bone density and structure, unspecified site: Secondary | ICD-10-CM

## 2018-08-15 DIAGNOSIS — I358 Other nonrheumatic aortic valve disorders: Secondary | ICD-10-CM

## 2018-08-15 DIAGNOSIS — I10 Essential (primary) hypertension: Secondary | ICD-10-CM | POA: Diagnosis not present

## 2018-08-15 DIAGNOSIS — Z1231 Encounter for screening mammogram for malignant neoplasm of breast: Secondary | ICD-10-CM

## 2018-08-15 DIAGNOSIS — E875 Hyperkalemia: Secondary | ICD-10-CM | POA: Diagnosis not present

## 2018-08-15 DIAGNOSIS — Z0000 Encounter for general adult medical examination without abnormal findings: Secondary | ICD-10-CM

## 2018-08-15 DIAGNOSIS — Z23 Encounter for immunization: Secondary | ICD-10-CM

## 2018-08-15 LAB — POCT URINALYSIS DIPSTICK
APPEARANCE: NEGATIVE
Bilirubin, UA: NEGATIVE
Blood, UA: NEGATIVE
Glucose, UA: NEGATIVE
Ketones, UA: NEGATIVE
LEUKOCYTES UA: NEGATIVE
NITRITE UA: NEGATIVE
ODOR: NEGATIVE
PROTEIN UA: NEGATIVE
SPEC GRAV UA: 1.01 (ref 1.010–1.025)
Urobilinogen, UA: 0.2 E.U./dL
pH, UA: 7 (ref 5.0–8.0)

## 2018-08-15 MED ORDER — FLUCONAZOLE 150 MG PO TABS: 150.0000 mg | ORAL_TABLET | Freq: Once | ORAL | 1 refills | Status: DC

## 2018-08-15 MED ORDER — ALENDRONATE SODIUM 70 MG PO TABS: ORAL_TABLET | ORAL | 3 refills | Status: AC

## 2018-08-15 MED ORDER — LISINOPRIL 20 MG PO TABS: 20.0000 mg | ORAL_TABLET | Freq: Every day | ORAL | 3 refills | Status: AC

## 2018-08-15 NOTE — Patient Instructions (Addendum)
Continue Fosamax for now.  Have bone density study and mammogram in the near future.  Serum potassium repeated because of elevation on recent labs at 5.9.  Flu vaccine given

## 2018-08-15 NOTE — Progress Notes (Signed)
Subjective:    Patient ID: Peggy Nelson, female    DOB: 03-21-1946, 72 y.o.   MRN: [id number]  HPI 72 year old Female for health maintenance exam, Medicare wellness,  and evaluation of medical issues.  Had colonoscopy at [facility] this past Spring. Says it was normal. We will request report. Says she is to do Cologard in 5 years.  Prescription for Shigrix given.  History of hypertension and osteopenia.  Takes Fosamax and lisinopril.  She had a bone density study in 2015 showing a T score in the left femoral neck of -1.9 and -3.6 in the left forearm.  Study ordered for the near future.  Has been treated with lisinopril for hypertension since November 2011.  Had 2D echocardiogram in 2010 because of cardiac murmur.  She had mildly calcified aortic valve leaflets consistent with aortic sclerosis.  No SBE prophylaxis needed.  Compound fracture of her toes in 1960.  Tonsillectomy 1954.  She has lived in [place] for over 30 years and is a native of [place].  And Zostavax vaccine August 2010.  History of tendinitis right foot for which she has taken Naprosyn in the past.  Had tetanus immunization in 2008.  History of eosinophilia.  Social history: One brother and 2 sisters in good health.  Patient does not smoke.  Social alcohol consumption.  She is a widow.  Spouse died at age 98 of septic shock.  She is to take lisinopril HCTZ but was found to be hyponatremic on HCTZ so diuretic was discontinued.  Started on Fosamax weekly in October 2010.  Family history: Father died at age 80 of cancer.  Mother died at age 49 with history of hypertension.   Has mammogram and bone density study ordered.  Labs reviewed.  Potassium is elevated at 5.9 and I think it is an error.  It was repeated today and is 4.6.  She has a high HDL of 97.  Total cholesterol is 210 with an LDL cholesterol of 100 and normal triglycerides.  Fasting glucose was 102.  TSH is normal.  BUN and creatinine are  normal.     Review of Systems no new complaints     Objective:   Physical Exam  Constitutional: She is oriented to person, place, and time. She appears well-developed and well-nourished. No distress.  HENT:  Head: Atraumatic.  Right Ear: External ear normal.  Left Ear: External ear normal.  Mouth/Throat: Oropharynx is clear and moist. No oropharyngeal exudate.  Eyes: Pupils are equal, round, and reactive to light. EOM are normal. Right eye exhibits no discharge. Left eye exhibits no discharge. No scleral icterus.  Neck: Neck supple. No JVD present. No thyromegaly present.  Cardiovascular: Normal rate, regular rhythm, normal heart sounds and intact distal pulses.  1/6 systolic ejection murmur  Pulmonary/Chest: Effort normal. No stridor. No respiratory distress. She has no wheezes. She has no rales.  Abdominal: Soft. She exhibits no distension and no mass. There is no tenderness. There is no guarding.  Genitourinary:  Genitourinary Comments: Bimanual normal.  Pap deferred due to age.  Musculoskeletal: She exhibits no edema.  Neurological: She is alert and oriented to person, place, and time. She displays normal reflexes. No cranial nerve deficit. Coordination normal.  Skin: Skin is warm and dry. She is not diaphoretic.  Psychiatric: She has a normal mood and affect. Her behavior is normal. Judgment and thought content normal.  Vitals reviewed.         Assessment & Plan:  Aortic sclerosis-stable  Osteoporosis based on bone density study years ago.  New bone density study ordered.  She is been on Fosamax.  Essential hypertension-stable on current regimen.  History of eosinophilia-stable.  Plan: Return in 1 year or as needed.  Will continue same medications.  Flu vaccine given

## 2018-08-16 LAB — POTASSIUM: POTASSIUM: 4.6 mmol/L (ref 3.5–5.3)

## 2018-09-06 ENCOUNTER — Encounter: Payer: Self-pay | Admitting: Internal Medicine

## 2018-09-10 ENCOUNTER — Ambulatory Visit
Admission: RE | Admit: 2018-09-10 | Discharge: 2018-09-10 | Disposition: A | Discharge status: Home / Self Care | Payer: PPO | Source: Ambulatory Visit | Attending: Internal Medicine | Admitting: Internal Medicine

## 2018-09-10 DIAGNOSIS — M85852 Other specified disorders of bone density and structure, left thigh: Secondary | ICD-10-CM | POA: Diagnosis not present

## 2018-09-10 DIAGNOSIS — M8580 Other specified disorders of bone density and structure, unspecified site: Secondary | ICD-10-CM

## 2018-09-27 ENCOUNTER — Ambulatory Visit
Admission: RE | Admit: 2018-09-27 | Discharge: 2018-09-27 | Disposition: A | Discharge status: Home / Self Care | Payer: PPO | Source: Ambulatory Visit | Attending: Internal Medicine | Admitting: Internal Medicine

## 2018-09-27 DIAGNOSIS — Z1231 Encounter for screening mammogram for malignant neoplasm of breast: Secondary | ICD-10-CM | POA: Diagnosis not present

## 2019-02-25 ENCOUNTER — Telehealth: Payer: Self-pay | Admitting: Internal Medicine

## 2019-02-25 NOTE — Telephone Encounter (Signed)
Sorry, I do not know any physician in that area

## 2019-02-25 NOTE — Telephone Encounter (Signed)
Called patient back to let her know what Dr Beryle Quant reply was

## 2019-02-25 NOTE — Telephone Encounter (Signed)
Peggy Nelson [phone]  [name] called to say she is moving to [facility] area in July to be around family and she wanted to know if you know of any good PCP in that area she could go to?

## 2019-04-22 DIAGNOSIS — H35033 Hypertensive retinopathy, bilateral: Secondary | ICD-10-CM | POA: Diagnosis not present

## 2019-04-22 DIAGNOSIS — H35363 Drusen (degenerative) of macula, bilateral: Secondary | ICD-10-CM | POA: Diagnosis not present

## 2019-04-22 DIAGNOSIS — Z961 Presence of intraocular lens: Secondary | ICD-10-CM | POA: Diagnosis not present

## 2019-04-22 DIAGNOSIS — H35372 Puckering of macula, left eye: Secondary | ICD-10-CM | POA: Diagnosis not present

## 2019-08-06 NOTE — Telephone Encounter (Addendum)
Called patient to see if she had moved, or if she needed to schedule CPE. She has moved and is now going to [facility], in [place]. So I have removed DR Renold Genta as PCP.

## 2019-08-29 IMAGING — MG DIGITAL SCREENING BILATERAL MAMMOGRAM WITH TOMO AND CAD
8 series · 8 of 24 positions shown · non-contrast
Comparison: Previous exam(s).

CLINICAL DATA: Screening.

EXAM:
DIGITAL SCREENING BILATERAL MAMMOGRAM WITH TOMO AND CAD

[R CC synth-2D]
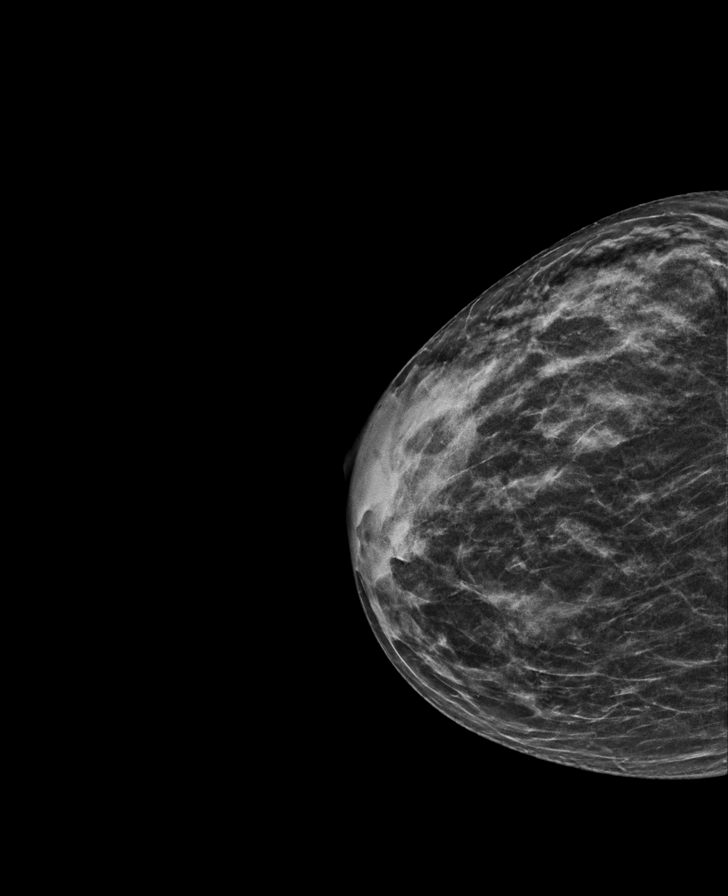

[L CC synth-2D]
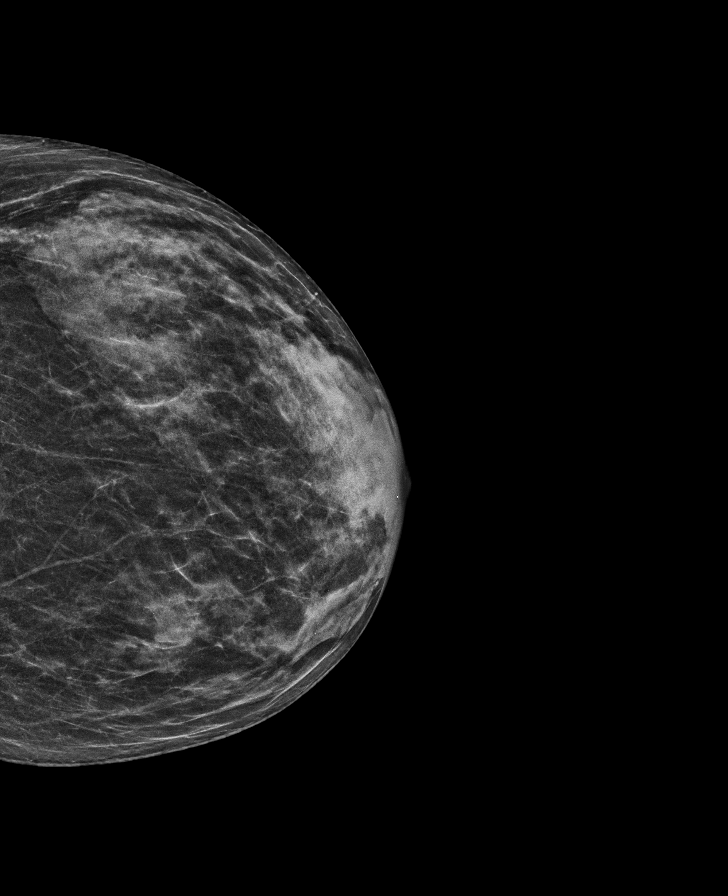

[R MLO synth-2D]
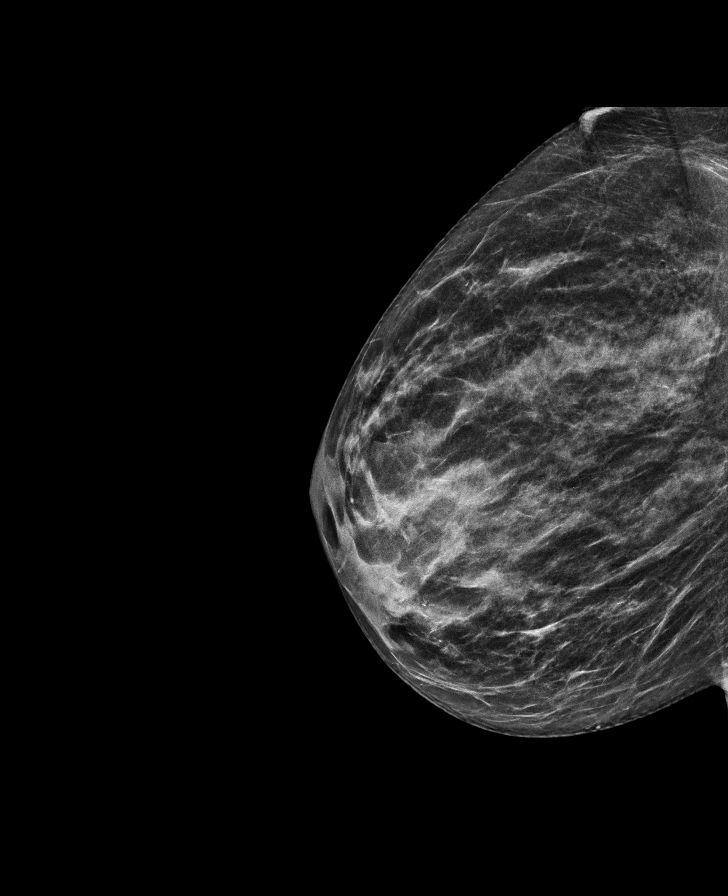

[L MLO synth-2D]
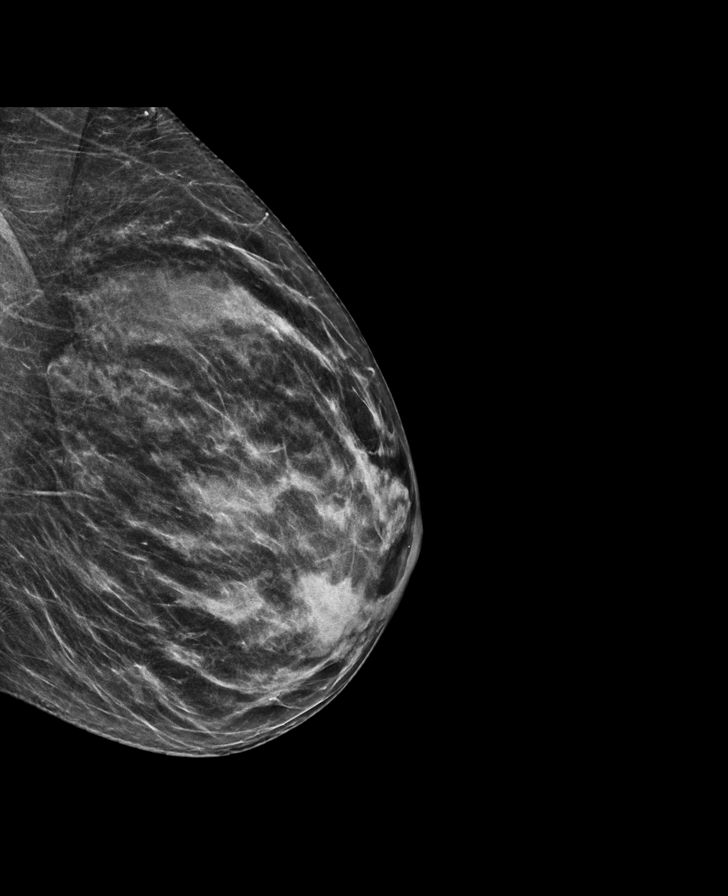

[R CC tomo · tomo slice 27/54.0]
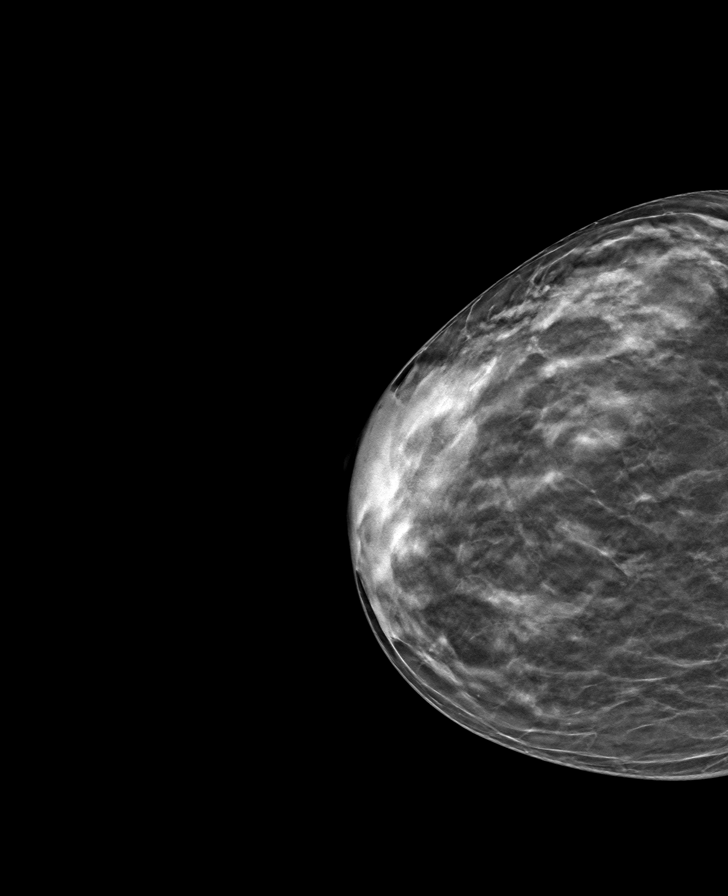

[L MLO tomo · tomo slice 31/60.0]
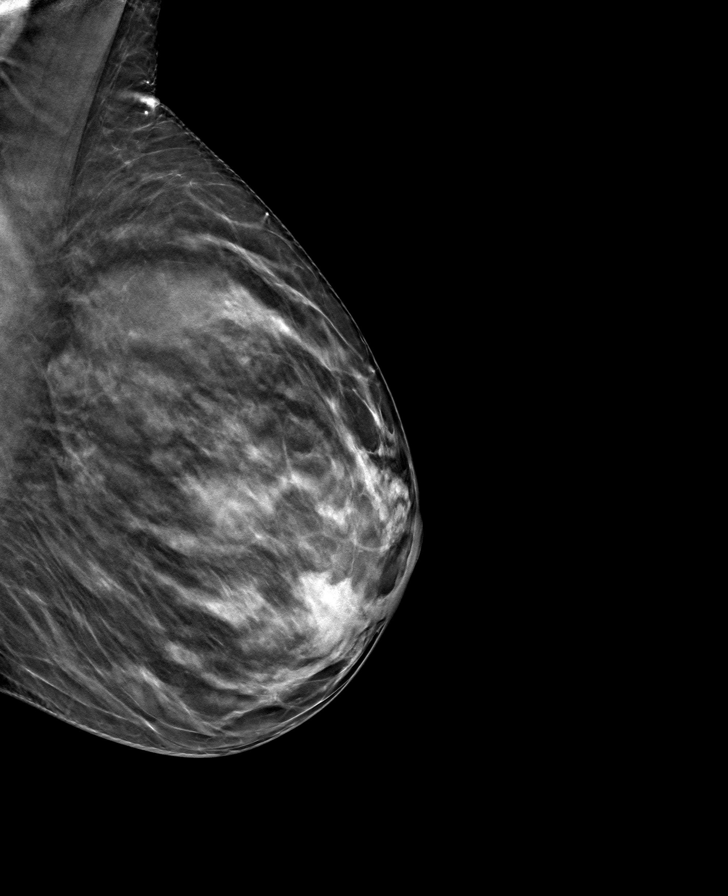

[L CC tomo · tomo slice 28/55.0]
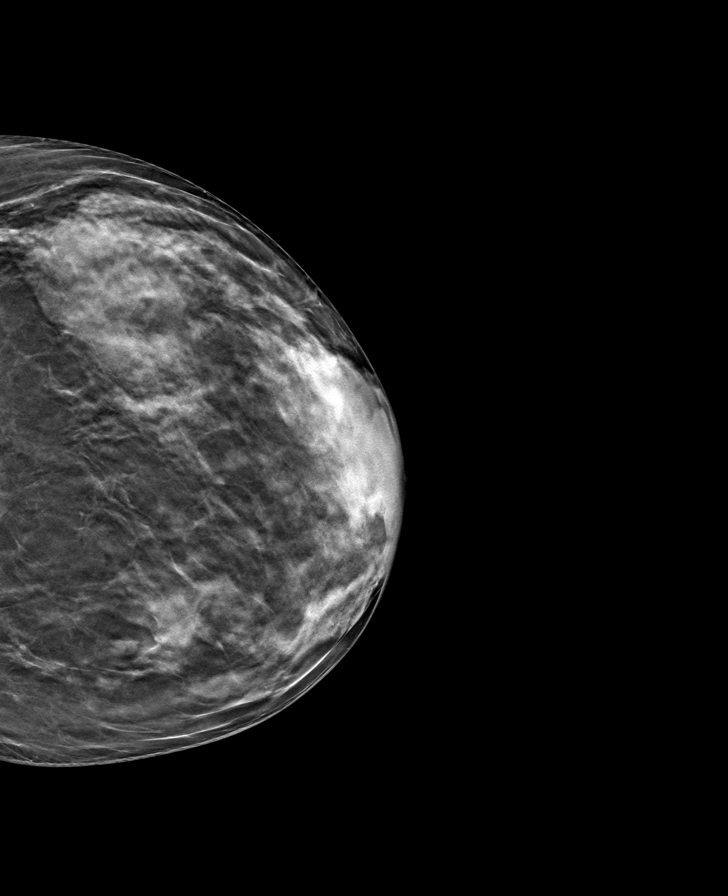

[R MLO tomo · tomo slice 29/57.0]
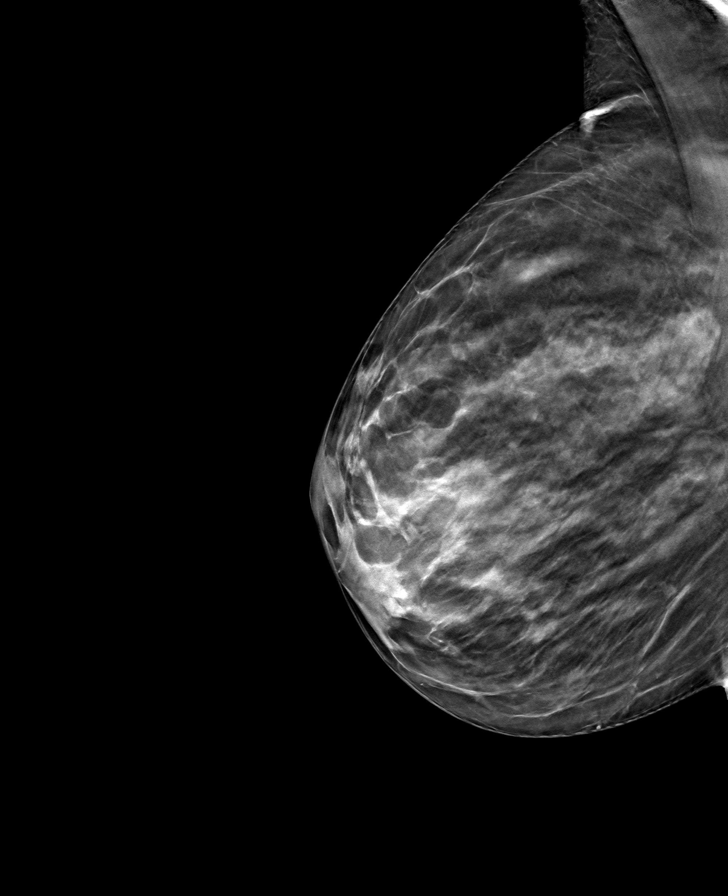

[8 of 24 positions shown; findings below may reference images not displayed]

ACR Breast Density Category c: The breast tissue is heterogeneously
dense, which may obscure small masses.
FINDINGS: There are no findings suspicious for malignancy. Images were
processed with CAD.
IMPRESSION: No mammographic evidence of malignancy. A result letter of this
screening mammogram will be mailed directly to the patient.

RECOMMENDATION:
Screening mammogram in one year. (Code:FT-U-LHB)

BI-RADS CATEGORY  1: Negative.
